# Patient Record
Sex: Female | Born: 1985
Health system: Southern US, Community
[De-identification: ages and names within clinical notes are randomized; demographics above are authoritative.]

## PROBLEM LIST (undated history)

## (undated) DIAGNOSIS — J309 Allergic rhinitis, unspecified: Secondary | ICD-10-CM

## (undated) DIAGNOSIS — M779 Enthesopathy, unspecified: Secondary | ICD-10-CM

## (undated) DIAGNOSIS — N83209 Unspecified ovarian cyst, unspecified side: Secondary | ICD-10-CM

## (undated) DIAGNOSIS — E669 Obesity, unspecified: Secondary | ICD-10-CM

## (undated) DIAGNOSIS — M1990 Unspecified osteoarthritis, unspecified site: Secondary | ICD-10-CM

## (undated) DIAGNOSIS — J129 Viral pneumonia, unspecified: Secondary | ICD-10-CM

## (undated) HISTORY — DX: Unspecified ovarian cyst, unspecified side: N83.209

## (undated) HISTORY — PX: DENTAL SURGERY: SHX609

## (undated) HISTORY — DX: Allergic rhinitis, unspecified: J30.9

## (undated) HISTORY — DX: Viral pneumonia, unspecified: J12.9

## (undated) HISTORY — DX: Obesity, unspecified: E66.9

## (undated) HISTORY — DX: Enthesopathy, unspecified: M77.9

---
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
## 2008-01-03 ENCOUNTER — Emergency Department: Payer: Self-pay | Admitting: Emergency Medicine

## 2008-01-06 ENCOUNTER — Emergency Department: Payer: Self-pay | Admitting: Emergency Medicine

## 2014-12-01 DIAGNOSIS — J129 Viral pneumonia, unspecified: Secondary | ICD-10-CM

## 2014-12-01 HISTORY — DX: Viral pneumonia, unspecified: J12.9

## 2014-12-16 ENCOUNTER — Emergency Department: Payer: Self-pay

## 2015-12-02 NOTE — L&D Delivery Note (Signed)
Obstetrical Delivery Note   Date of Delivery:   11/19/2016 Primary OB:   [facility] Gestational Age/EDD: [redacted]w[redacted]d (Dated by 8wk ultrasound) Antepartum complications: non-reassuring fetal testing and maternal obesity  Delivered By:   Dalia Heading, CNM  Delivery Type:   spontaneous vaginal delivery  Procedure Details:   Had an induction of labor for a non reassuring fetal heart tracing (Cervidil, Pitocin). Called to see patient for lower abdominal pain and was noted to be C/C/+2. With good maternal effort, mother pushed to deliver a vigorous female infant over an intact perineum. Baby placed on mother's abdomen and dried. Cord was clamped (delay of 30-40 sec) and cut by father of baby. Baby placed skin to skin. Baby's temp noted to be 100.1 and mother's temp was 101 after spontaneous delivery of the placenta. No fever 2 hours earlier Anesthesia:    epidural Intrapartum complications: intermittent Cat 2 tracing (decreased variability for short episodes) GBS:    negative Laceration:    none Episiotomy:    none Placenta:    Via active 3rd stage. To pathology: no Estimated Blood Loss:  400 ml  Baby:    Liveborn female, Apgars 8/9, weight pending    Beckie Viscardi, CNM

## 2016-04-01 LAB — OB RESULTS CONSOLE VARICELLA ZOSTER ANTIBODY, IGG: VARICELLA IGG: IMMUNE

## 2016-04-01 LAB — OB RESULTS CONSOLE ANTIBODY SCREEN: Antibody Screen: NEGATIVE

## 2016-04-01 LAB — OB RESULTS CONSOLE RPR: RPR: NONREACTIVE

## 2016-04-01 LAB — OB RESULTS CONSOLE HEPATITIS B SURFACE ANTIGEN: HEP B S AG: NEGATIVE

## 2016-04-01 LAB — OB RESULTS CONSOLE HIV ANTIBODY (ROUTINE TESTING): HIV: NONREACTIVE

## 2016-04-01 LAB — OB RESULTS CONSOLE ABO/RH: RH Type: POSITIVE

## 2016-04-01 LAB — OB RESULTS CONSOLE RUBELLA ANTIBODY, IGM: Rubella: IMMUNE

## 2016-04-03 LAB — OB RESULTS CONSOLE ABO/RH

## 2016-04-03 LAB — OB RESULTS CONSOLE GC/CHLAMYDIA
CHLAMYDIA, DNA PROBE: NEGATIVE
GC PROBE AMP, GENITAL: NEGATIVE

## 2016-04-22 ENCOUNTER — Other Ambulatory Visit: Payer: Self-pay | Admitting: Nurse Practitioner

## 2016-04-22 DIAGNOSIS — R197 Diarrhea, unspecified: Secondary | ICD-10-CM

## 2016-04-22 DIAGNOSIS — R1010 Upper abdominal pain, unspecified: Secondary | ICD-10-CM

## 2016-05-15 ENCOUNTER — Ambulatory Visit
Admission: RE | Admit: 2016-05-15 | Discharge: 2016-05-15 | Disposition: A | Discharge status: Home / Self Care | Payer: 59 | Source: Ambulatory Visit | Attending: Nurse Practitioner | Admitting: Nurse Practitioner

## 2016-05-15 DIAGNOSIS — R109 Unspecified abdominal pain: Secondary | ICD-10-CM | POA: Insufficient documentation

## 2016-05-15 DIAGNOSIS — O9989 Other specified diseases and conditions complicating pregnancy, childbirth and the puerperium: Secondary | ICD-10-CM | POA: Diagnosis present

## 2016-05-15 DIAGNOSIS — R197 Diarrhea, unspecified: Secondary | ICD-10-CM | POA: Diagnosis not present

## 2016-05-15 DIAGNOSIS — Z3A00 Weeks of gestation of pregnancy not specified: Secondary | ICD-10-CM | POA: Insufficient documentation

## 2016-05-15 DIAGNOSIS — R1010 Upper abdominal pain, unspecified: Secondary | ICD-10-CM

## 2016-06-09 ENCOUNTER — Other Ambulatory Visit: Payer: Self-pay | Admitting: Advanced Practice Midwife

## 2016-06-09 DIAGNOSIS — Z3689 Encounter for other specified antenatal screening: Secondary | ICD-10-CM

## 2016-06-23 ENCOUNTER — Ambulatory Visit
Admission: RE | Admit: 2016-06-23 | Discharge: 2016-06-23 | Disposition: A | Discharge status: Home / Self Care | Payer: 59 | Source: Ambulatory Visit | Attending: Obstetrics and Gynecology | Admitting: Obstetrics and Gynecology

## 2016-06-23 ENCOUNTER — Ambulatory Visit: Payer: 59

## 2016-06-23 DIAGNOSIS — Z3A18 18 weeks gestation of pregnancy: Secondary | ICD-10-CM | POA: Diagnosis not present

## 2016-06-23 DIAGNOSIS — IMO0002 Reserved for concepts with insufficient information to code with codable children: Secondary | ICD-10-CM

## 2016-06-23 DIAGNOSIS — O99212 Obesity complicating pregnancy, second trimester: Secondary | ICD-10-CM | POA: Insufficient documentation

## 2016-06-23 DIAGNOSIS — Z3689 Encounter for other specified antenatal screening: Secondary | ICD-10-CM

## 2016-06-23 DIAGNOSIS — E669 Obesity, unspecified: Secondary | ICD-10-CM | POA: Diagnosis not present

## 2016-06-23 DIAGNOSIS — O351XX0 Maternal care for (suspected) chromosomal abnormality in fetus, not applicable or unspecified: Secondary | ICD-10-CM | POA: Insufficient documentation

## 2016-07-04 IMAGING — US US ABDOMEN LIMITED
1 series · 14 of 25 positions shown · non-contrast
Comparison: None.

CLINICAL DATA: 29-year-old pregnant female with upper abdominal
pain. Chronic intermittent diarrhea.

EXAM:
US ABDOMEN LIMITED - RIGHT UPPER QUADRANT

[Series 1: us abdomen limited · 0.25mm/px · 14 of 49 slices shown]
[im 1/49]
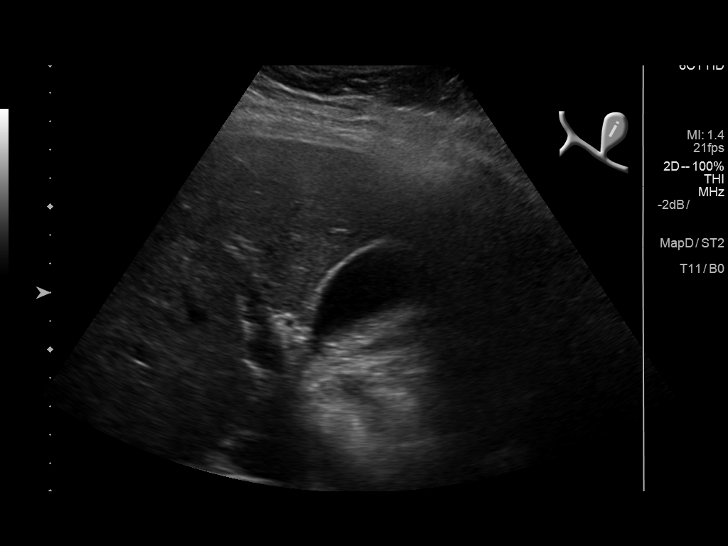
[im 5/49]
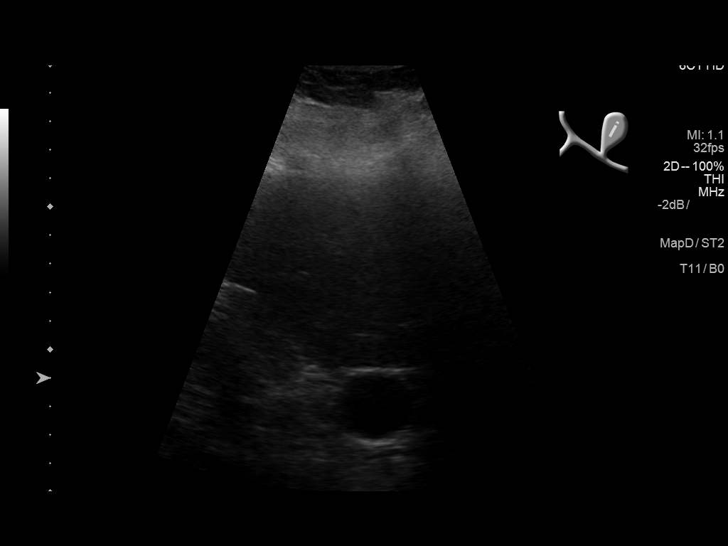
[im 9/49]
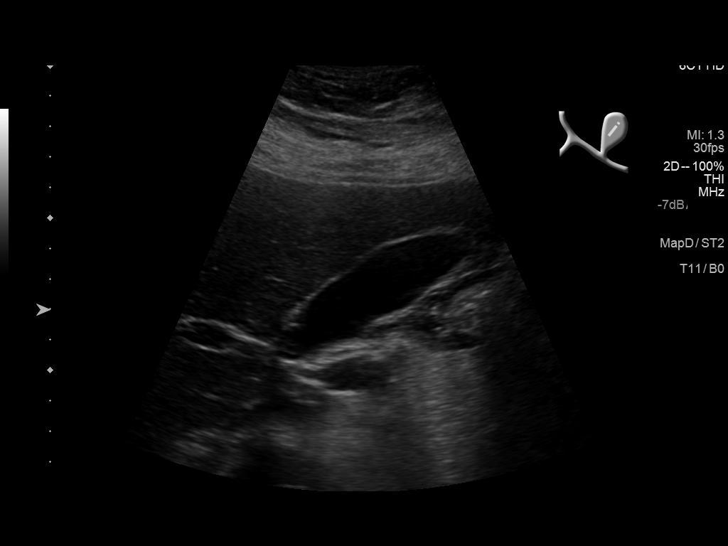
[im 13/49]
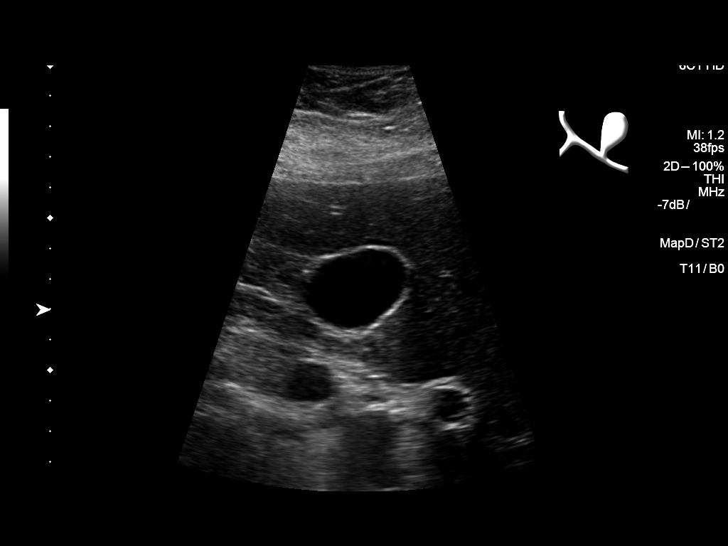
[im 17/49]
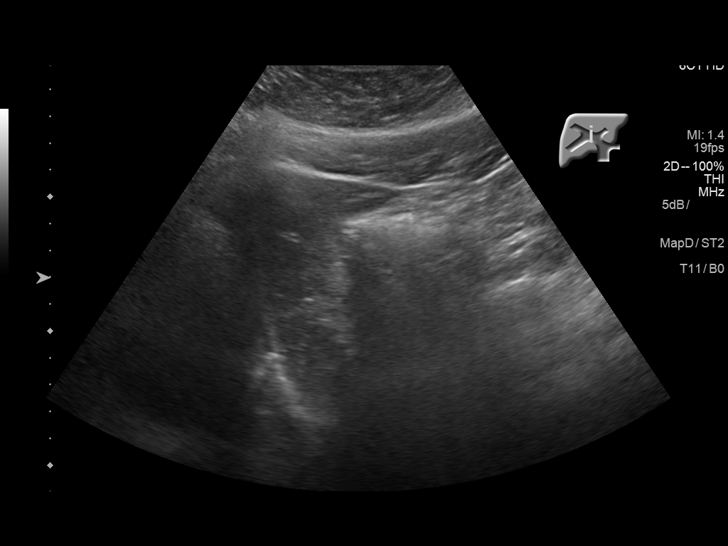
[im 19/49]
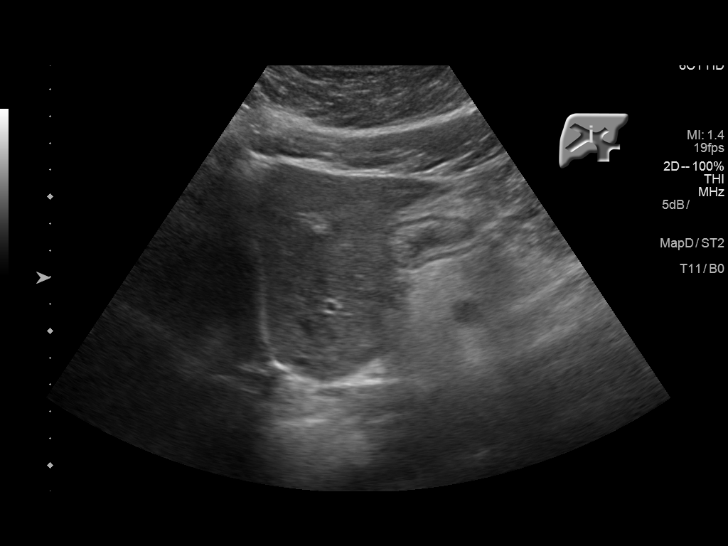
[im 23/49]
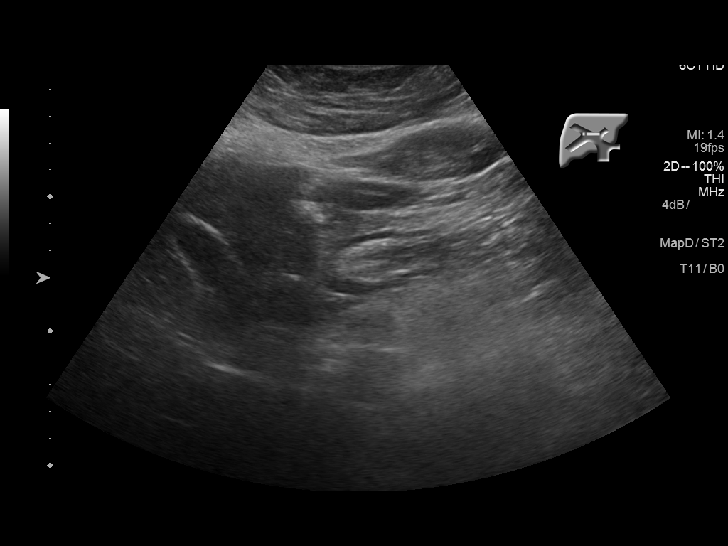
[im 27/49]
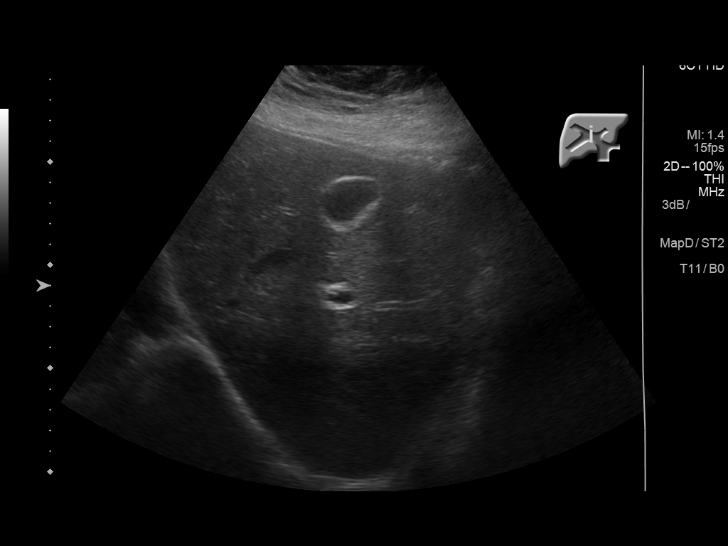
[im 31/49]
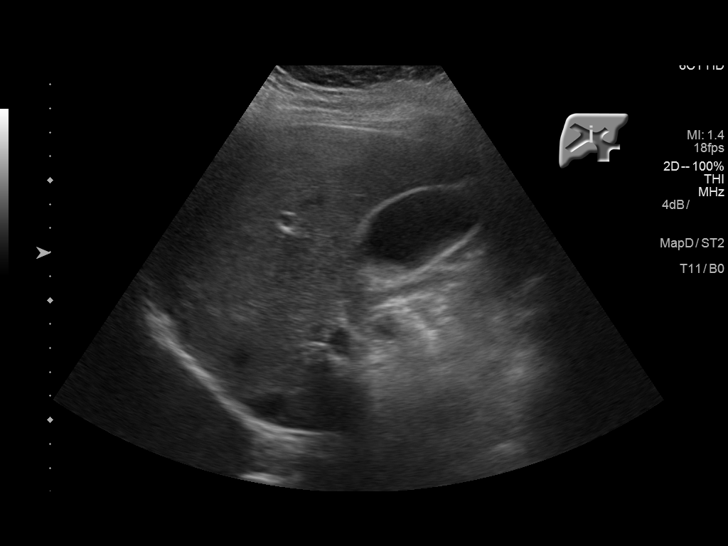
[im 33/49]
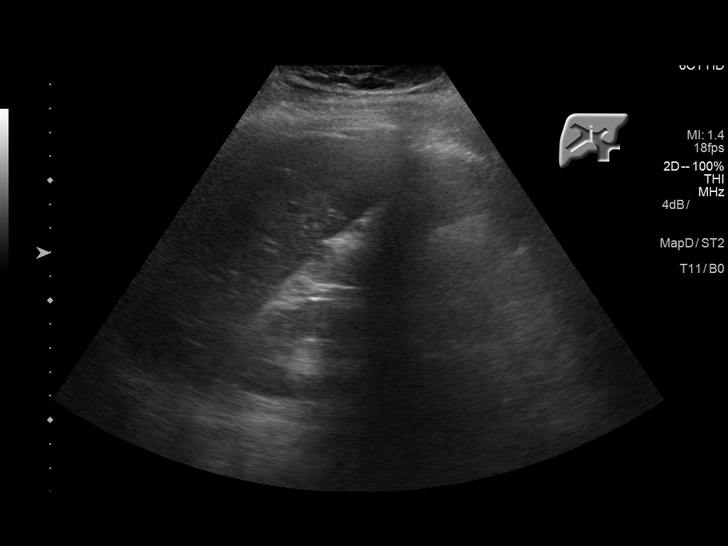
[im 37/49]
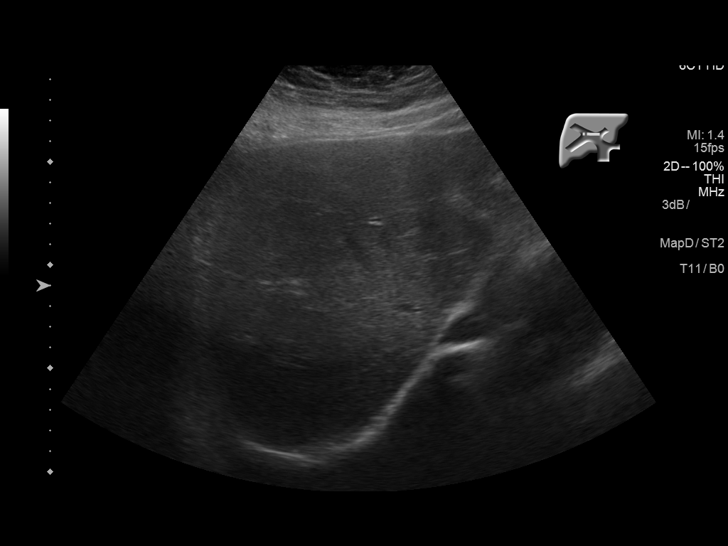
[im 41/49]
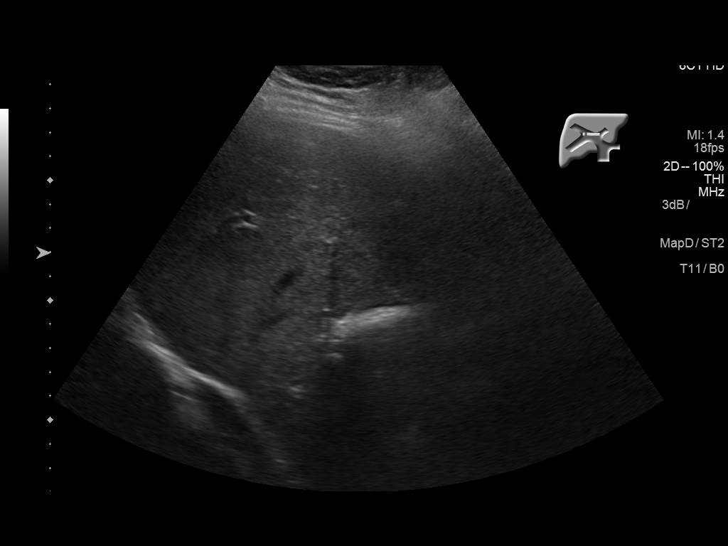
[im 45/49]
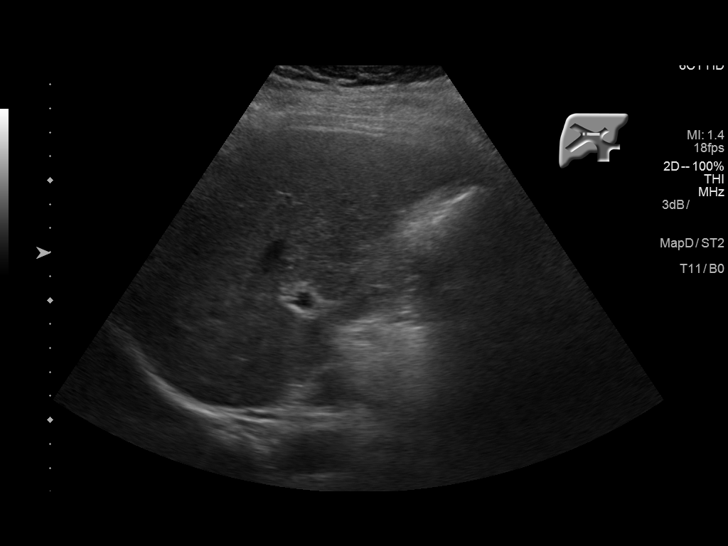
[im 49/49]
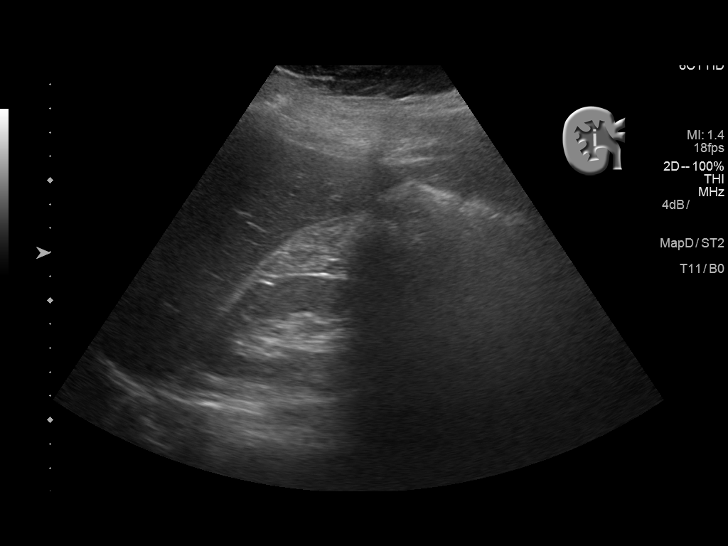

[14 of 25 positions shown; findings below may reference images not displayed]

FINDINGS: Gallbladder:

No gallstones or wall thickening visualized. No sonographic Murphy
sign noted by sonographer.

Common bile duct:

Diameter: 3 mm

Liver:

No focal lesion identified. Within normal limits in parenchymal
echogenicity.
IMPRESSION: Normal right upper quadrant abdominal sonogram, with no
cholelithiasis.

## 2016-08-12 IMAGING — US US MFM OB DETAIL+14 WK
2 series · 14 of 28 positions shown · non-contrast
Comparison: none

[Series 1: us mfm ob detail+14 wk · 0.25mm/px · 13 of 68 slices shown (1 of 2)]
[im 3/68]
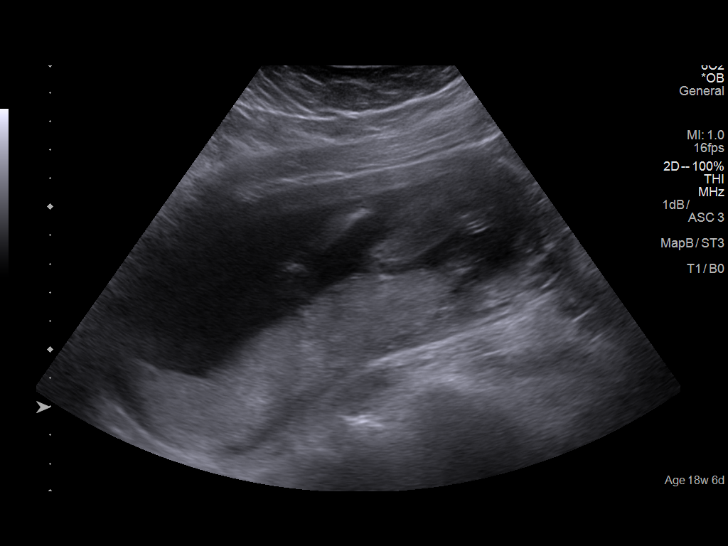
[im 8/68]
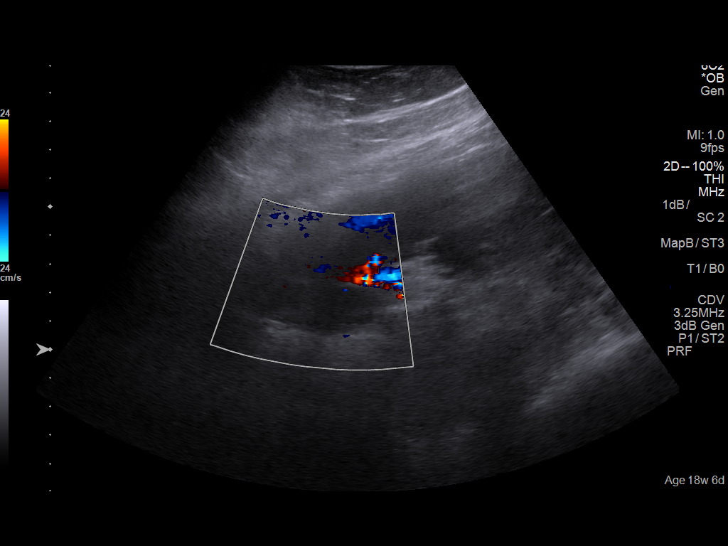
[im 13/68]
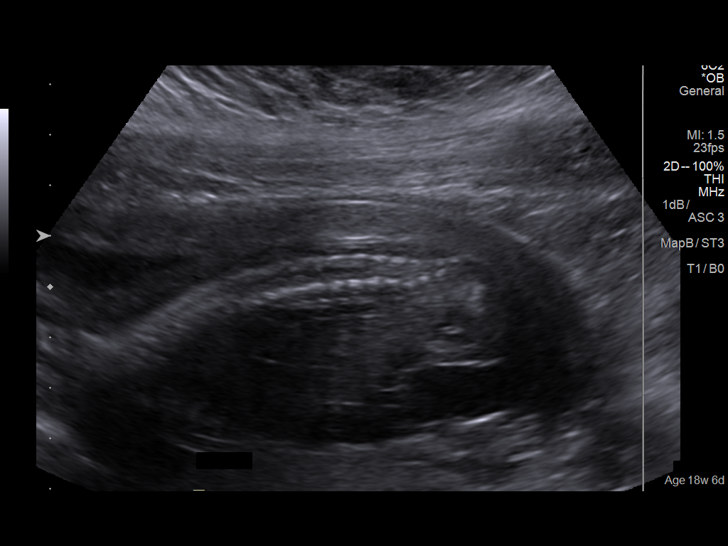
[im 19/68]
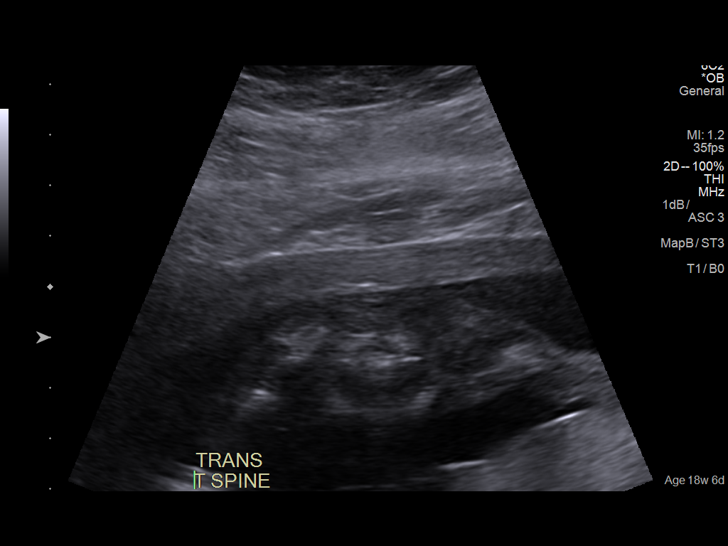
[im 24/68]
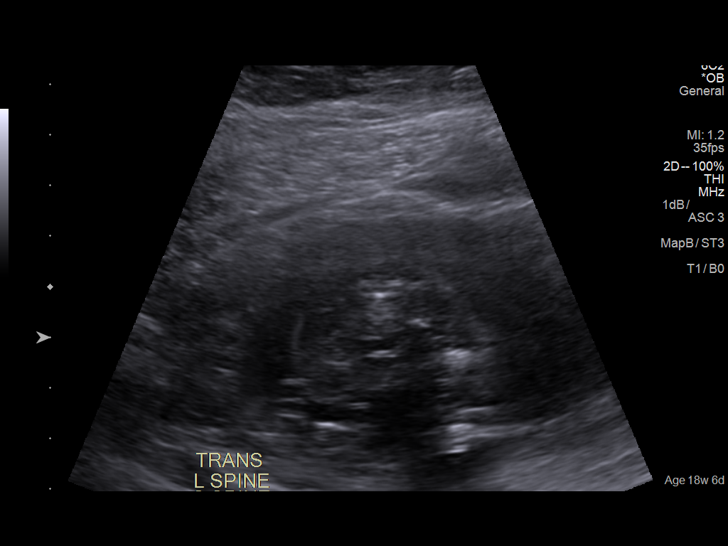
[im 29/68]
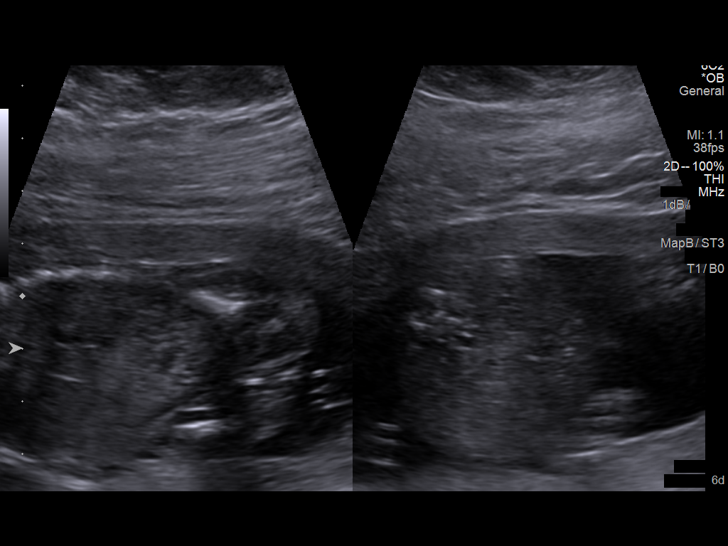
[im 34/68]
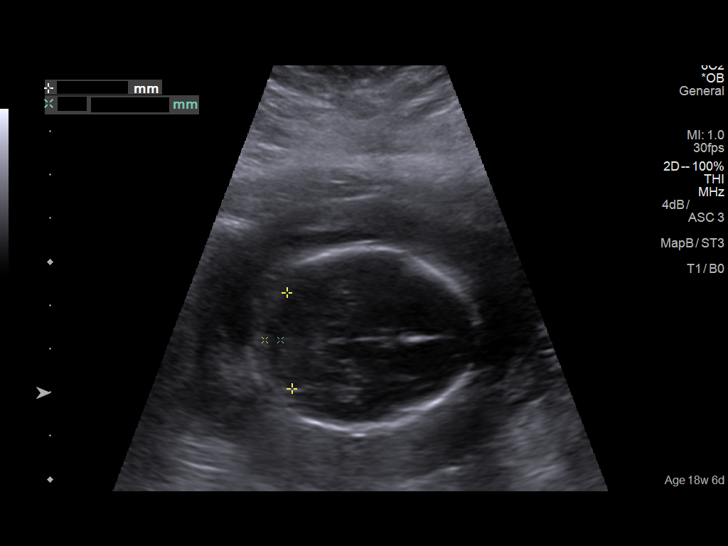
[im 39/68]
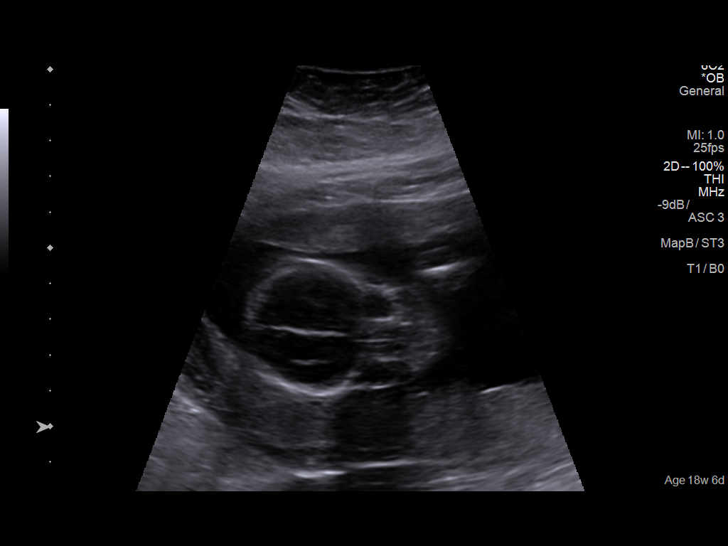
[im 44/68]
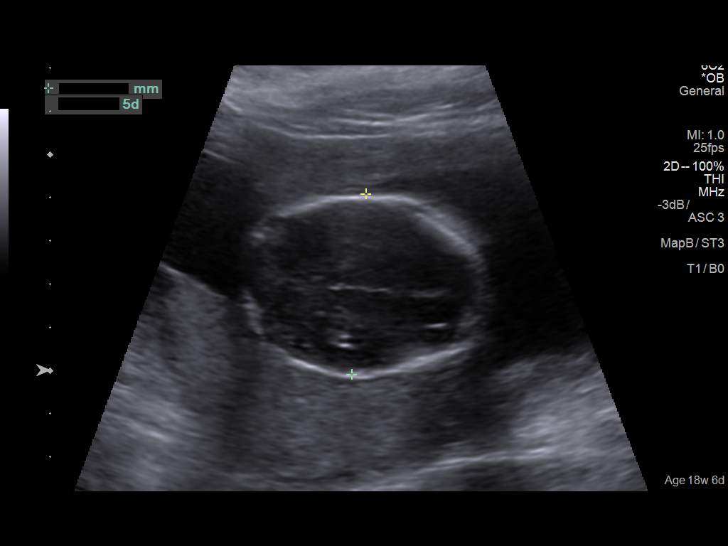
[im 49/68]
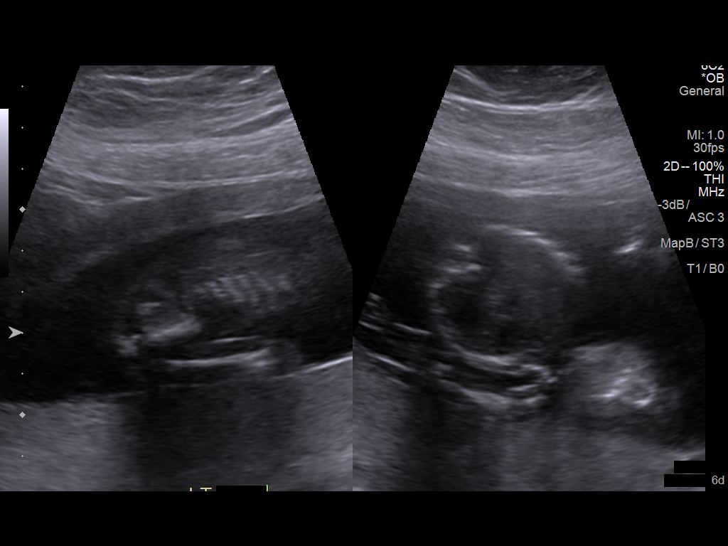
[im 55/68]
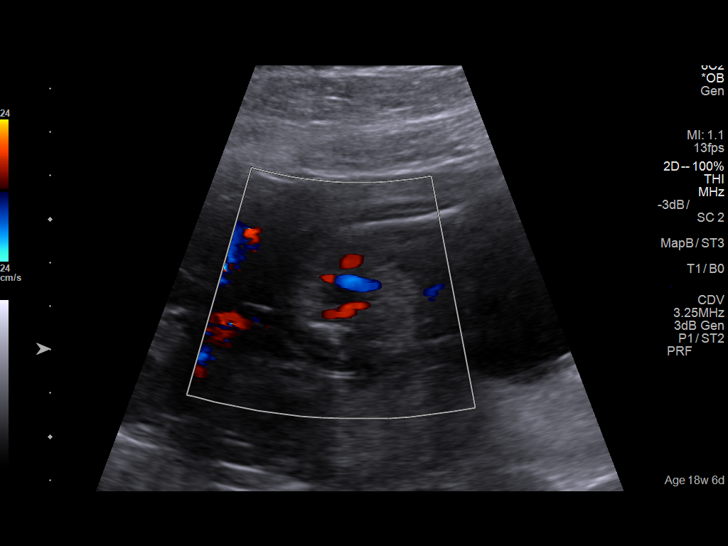
[im 60/68]
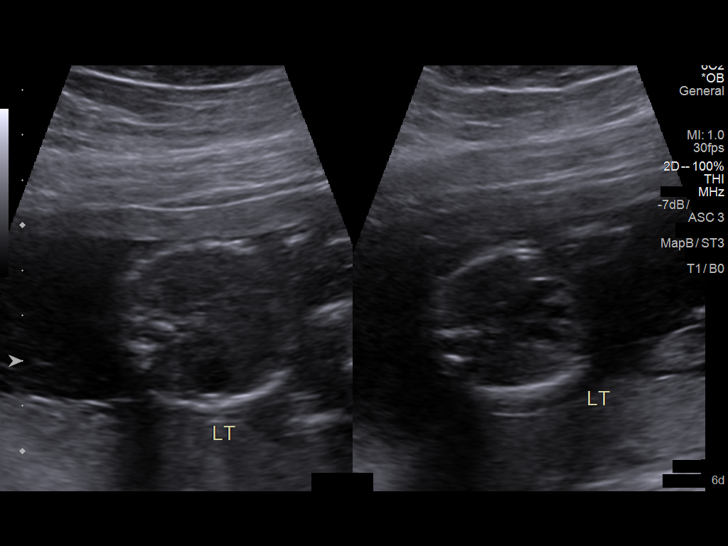
[im 65/68]
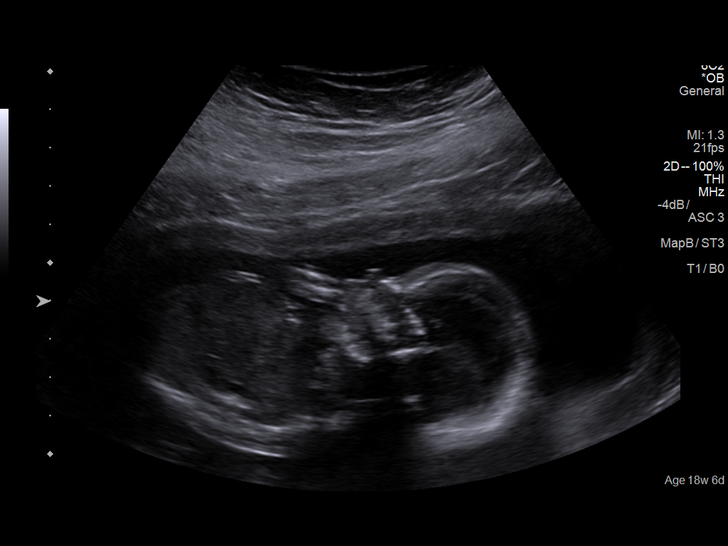

[Series 1001: us mfm ob detail+14 wk · 0.19mm/px · 1 of 3 slices shown (2 of 2)]
[im 1/3]
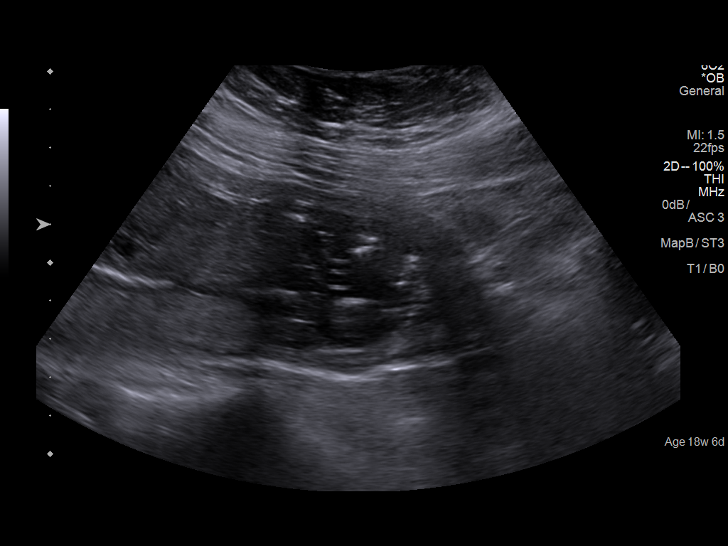

[14 of 28 positions shown; findings below may reference images not displayed]

Canned report from images found in remote index.

Refer to host system for actual result text.

## 2016-10-21 LAB — OB RESULTS CONSOLE GBS: GBS: NEGATIVE

## 2016-11-17 ENCOUNTER — Inpatient Hospital Stay
Admission: EM | Admit: 2016-11-17 | Discharge: 2016-11-20 | DRG: 774 | Disposition: A | Discharge status: Home / Self Care | Payer: 59 | Attending: Certified Nurse Midwife | Admitting: Certified Nurse Midwife

## 2016-11-17 DIAGNOSIS — O99214 Obesity complicating childbirth: Secondary | ICD-10-CM | POA: Diagnosis present

## 2016-11-17 DIAGNOSIS — O864 Pyrexia of unknown origin following delivery: Secondary | ICD-10-CM | POA: Diagnosis not present

## 2016-11-17 DIAGNOSIS — Z6841 Body Mass Index (BMI) 40.0 and over, adult: Secondary | ICD-10-CM | POA: Diagnosis not present

## 2016-11-17 DIAGNOSIS — Z87891 Personal history of nicotine dependence: Secondary | ICD-10-CM | POA: Diagnosis not present

## 2016-11-17 DIAGNOSIS — IMO0002 Reserved for concepts with insufficient information to code with codable children: Secondary | ICD-10-CM | POA: Diagnosis present

## 2016-11-17 DIAGNOSIS — Z3A39 39 weeks gestation of pregnancy: Secondary | ICD-10-CM

## 2016-11-17 LAB — TYPE AND SCREEN
ABO/RH(D): O POS
ANTIBODY SCREEN: NEGATIVE

## 2016-11-17 LAB — CBC
HCT: 36.1 % (ref 35.0–47.0)
HEMOGLOBIN: 12.8 g/dL (ref 12.0–16.0)
MCH: 31.9 pg (ref 26.0–34.0)
MCHC: 35.5 g/dL (ref 32.0–36.0)
MCV: 90 fL (ref 80.0–100.0)
PLATELETS: 241 10*3/uL (ref 150–440)
RBC: 4 MIL/uL (ref 3.80–5.20)
RDW: 13.9 % (ref 11.5–14.5)
WBC: 9.1 10*3/uL (ref 3.6–11.0)

## 2016-11-17 MED ORDER — BUTORPHANOL TARTRATE 1 MG/ML IJ SOLN
1.0000 mg | INTRAMUSCULAR | Status: DC | PRN
  Administered 2016-11-18: 1 mg via INTRAVENOUS
  Filled 2016-11-17: qty 1

## 2016-11-17 MED ORDER — ONDANSETRON HCL 4 MG/2ML IJ SOLN
4.0000 mg | Freq: Four times a day (QID) | INTRAMUSCULAR | Status: DC | PRN
  Administered 2016-11-18: 4 mg via INTRAVENOUS
  Filled 2016-11-17: qty 2

## 2016-11-17 MED ORDER — OXYTOCIN 40 UNITS IN LACTATED RINGERS INFUSION - SIMPLE MED
2.5000 [IU]/h | INTRAVENOUS | Status: DC
  Filled 2016-11-17 (×2): qty 1000

## 2016-11-17 MED ORDER — LACTATED RINGERS IV SOLN
500.0000 mL | INTRAVENOUS | Status: DC | PRN
  Administered 2016-11-17: 500 mL via INTRAVENOUS

## 2016-11-17 MED ORDER — TERBUTALINE SULFATE 1 MG/ML IJ SOLN: 0.2500 mg | Freq: Once | INTRAMUSCULAR | Status: DC | PRN

## 2016-11-17 MED ORDER — OXYTOCIN BOLUS FROM INFUSION
500.0000 mL | Freq: Once | INTRAVENOUS | Status: AC
  Administered 2016-11-19: 500 mL via INTRAVENOUS

## 2016-11-17 MED ORDER — LACTATED RINGERS IV SOLN
INTRAVENOUS | Status: DC
  Administered 2016-11-17 – 2016-11-19 (×4): via INTRAVENOUS

## 2016-11-17 MED ORDER — DINOPROSTONE 10 MG VA INST
10.0000 mg | VAGINAL_INSERT | Freq: Once | VAGINAL | Status: AC
  Administered 2016-11-17: 10 mg via VAGINAL
  Filled 2016-11-17 (×2): qty 1

## 2016-11-17 MED ORDER — ACETAMINOPHEN 325 MG PO TABS
650.0000 mg | ORAL_TABLET | ORAL | Status: DC | PRN
  Administered 2016-11-19: 650 mg via ORAL
  Filled 2016-11-17: qty 2

## 2016-11-17 NOTE — H&P (Signed)
Obstetric History and Physical  Emily Zhang is a 30 y.o. G2P0101 with Estimated Date of Delivery: 11/18/16 per 8 wk [place] who presents at [redacted]w[redacted]d  presenting for IOL for obesity and cat 2 FHR tracing in office today. Pt had routine NST/AFI today, period of decreased variability and mild decelerations -unable to determine late vs early vs unrelated to contractions. Patient states she has been having rare contractions, no vaginal bleeding, intact membranes, with active fetal movement.    Prenatal Course Source of Care: [facility]  with onset of care at 8 weeks Pregnancy complications or risks: -BMI >40 - most recent EFW 76% -H/o 36 delivery - IOL for advanced dilation -Cystic hygroma seen in early pregnancy - not seen on repeat. Negative Panorama.  -Mild fetal pericardial effusion - fetal echo done.   Patient Active Problem List   Diagnosis Date Noted  . Abnormal fetal heart rate 11/17/2016   She plans to breastfeed She desires condoms for postpartum contraception.   Prenatal labs and studies: ABO, Rh: O+  Antibody: Negative Rubella: Immune Varicella: Immune RPR:  Non-reactive HBsAg:  Negative HIV: Negative GC/CT: Neg/Neg GBS: Negative 1 hr Glucola: 94   Genetic screening: Panorama normal    Prenatal Transfer Tool   Past Medical History:  Diagnosis Date  . Medical history non-contributory     Past Surgical History:  Procedure Laterality Date  . DENTAL SURGERY      OB History  Gravida Para Term Preterm AB Living  2 1 0 1   1  SAB TAB Ectopic Multiple Live Births          1    # Outcome Date GA Lbr Len/2nd Weight Sex Delivery Anes PTL Lv  2 Current           1 Preterm 11/25/02 [redacted]w[redacted]d   M Vag-Spont   LIV    Obstetric Comments  IOL for advanced dilation    Social History   Social History  . Marital status: Married    Spouse name: N/A  . Number of children: N/A  . Years of education: N/A   Social History Main Topics  . Smoking status: Former Research scientist (life sciences)  .  Smokeless tobacco: Never Used  . Alcohol use No  . Drug use: No  . Sexual activity: Yes   Other Topics Concern  . None   Social History Narrative  . None    History reviewed. No pertinent family history.  Prescriptions Prior to Admission  Medication Sig Dispense Refill Last Dose  . acetaminophen (TYLENOL) 500 MG tablet Take 500 mg by mouth every 6 (six) hours as needed.   11/16/2016  . Prenatal Vit-Fe Fumarate-FA (PRENATAL MULTIVITAMIN) TABS tablet Take 1 tablet by mouth daily at 12 noon.   11/16/2016  . loratadine (CLARITIN) 10 MG tablet Take 10 mg by mouth daily as needed for allergies.   Taking    Allergies  Allergen Reactions  . Vicodin [Hydrocodone-Acetaminophen] Rash    Review of Systems: Negative except for what is mentioned in HPI.  Physical Exam: BP 121/74   Pulse 88   Temp 97.9 F (36.6 C) (Oral)   Resp 18   Ht 5\' 3"  (1.6 m)   Wt 244 lb (110.7 kg)   LMP 02/12/2016   BMI 43.22 kg/m  GENERAL: Well-developed, well-nourished female in no acute distress.  LUNGS: Unlabored breathing  ABDOMEN: Soft, nontender, nondistended, gravid. EXTREMITIES: Nontender, no edema Cervical Exam: Dilatation 1cm   Effacement 30%   Station -3 at clinic  today   Presentation: cephalic FHT: Category: 1 Baseline rate 135 bpm   Variability moderate  Accelerations present   Decelerations none Contractions: irregular   Pertinent Labs/Studies:   No results found for this or any previous visit (from the past 24 hour(s)).  Assessment : IUP at [redacted]w[redacted]d, IOL for Obesity and cat 2 tracing   Plan: Admission for delivery  Cervidil now, ambien overnight if needed FWB - cat 1 tracing currently, will monitor closely

## 2016-11-18 ENCOUNTER — Inpatient Hospital Stay: Payer: 59 | Admitting: Anesthesiology

## 2016-11-18 LAB — RPR: RPR: NONREACTIVE

## 2016-11-18 MED ORDER — DIPHENHYDRAMINE HCL 50 MG/ML IJ SOLN: 12.5000 mg | INTRAMUSCULAR | Status: DC | PRN

## 2016-11-18 MED ORDER — LIDOCAINE HCL (PF) 1 % IJ SOLN
INTRAMUSCULAR | Status: DC | PRN
  Administered 2016-11-18: 3 mL via SUBCUTANEOUS

## 2016-11-18 MED ORDER — AMMONIA AROMATIC IN INHA
RESPIRATORY_TRACT | Status: AC
  Filled 2016-11-18: qty 10

## 2016-11-18 MED ORDER — ZOLPIDEM TARTRATE 5 MG PO TABS
ORAL_TABLET | ORAL | Status: AC
  Administered 2016-11-18: 5 mg via ORAL
  Filled 2016-11-18: qty 1

## 2016-11-18 MED ORDER — LIDOCAINE HCL (PF) 1 % IJ SOLN
INTRAMUSCULAR | Status: AC
  Filled 2016-11-18: qty 30

## 2016-11-18 MED ORDER — FENTANYL 2.5 MCG/ML W/ROPIVACAINE 0.2% IN NS 100 ML EPIDURAL INFUSION (ARMC-ANES)
9.0000 mL/h | EPIDURAL | Status: DC
  Administered 2016-11-19: 20 mL/h via EPIDURAL
  Administered 2016-11-19: 9 mL/h via EPIDURAL
  Filled 2016-11-18: qty 100

## 2016-11-18 MED ORDER — SODIUM CHLORIDE FLUSH 0.9 % IV SOLN
INTRAVENOUS | Status: AC
  Filled 2016-11-18: qty 10

## 2016-11-18 MED ORDER — ZOLPIDEM TARTRATE 5 MG PO TABS
5.0000 mg | ORAL_TABLET | Freq: Every evening | ORAL | Status: DC | PRN
  Administered 2016-11-18: 5 mg via ORAL

## 2016-11-18 MED ORDER — FENTANYL 2.5 MCG/ML W/ROPIVACAINE 0.2% IN NS 100 ML EPIDURAL INFUSION (ARMC-ANES)
EPIDURAL | Status: DC | PRN
  Administered 2016-11-18: 10 mL/h via EPIDURAL

## 2016-11-18 MED ORDER — LACTATED RINGERS IV SOLN: 500.0000 mL | Freq: Once | INTRAVENOUS | Status: DC

## 2016-11-18 MED ORDER — FENTANYL 2.5 MCG/ML W/ROPIVACAINE 0.2% IN NS 100 ML EPIDURAL INFUSION (ARMC-ANES)
EPIDURAL | Status: AC
  Filled 2016-11-18: qty 100

## 2016-11-18 MED ORDER — EPHEDRINE 5 MG/ML INJ
10.0000 mg | INTRAVENOUS | Status: DC | PRN
  Filled 2016-11-18: qty 2

## 2016-11-18 MED ORDER — TERBUTALINE SULFATE 1 MG/ML IJ SOLN: 0.2500 mg | Freq: Once | INTRAMUSCULAR | Status: DC | PRN

## 2016-11-18 MED ORDER — BUPIVACAINE HCL (PF) 0.25 % IJ SOLN
INTRAMUSCULAR | Status: DC | PRN
  Administered 2016-11-18: 3 mL via EPIDURAL
  Administered 2016-11-18: 5 mL via EPIDURAL

## 2016-11-18 MED ORDER — PHENYLEPHRINE 40 MCG/ML (10ML) SYRINGE FOR IV PUSH (FOR BLOOD PRESSURE SUPPORT)
80.0000 ug | PREFILLED_SYRINGE | INTRAVENOUS | Status: DC | PRN
  Filled 2016-11-18: qty 5

## 2016-11-18 MED ORDER — OXYTOCIN 40 UNITS IN LACTATED RINGERS INFUSION - SIMPLE MED
1.0000 m[IU]/min | INTRAVENOUS | Status: DC
  Administered 2016-11-18: 2 m[IU]/min via INTRAVENOUS

## 2016-11-18 MED ORDER — SODIUM CHLORIDE 0.9 % IJ SOLN
INTRAMUSCULAR | Status: AC
  Filled 2016-11-18: qty 50

## 2016-11-18 MED ORDER — MISOPROSTOL 200 MCG PO TABS
ORAL_TABLET | ORAL | Status: AC
  Filled 2016-11-18: qty 4

## 2016-11-18 MED ORDER — LIDOCAINE-EPINEPHRINE (PF) 1.5 %-1:200000 IJ SOLN
INTRAMUSCULAR | Status: DC | PRN
  Administered 2016-11-18: 3 mL via EPIDURAL

## 2016-11-18 MED ORDER — BUTORPHANOL TARTRATE 1 MG/ML IJ SOLN
1.0000 mg | INTRAMUSCULAR | Status: DC | PRN
  Administered 2016-11-18 (×2): 1 mg via INTRAVENOUS
  Filled 2016-11-18 (×2): qty 1

## 2016-11-18 NOTE — Progress Notes (Signed)
L&D Progress Note  S: Comfortable with epidural  O: 93/50 General: resting comfortably FHR: 130 baseline with accelerations to 150s to 160s, moderate variability. Will have episodes of decreased variability x 20-30 minutes at a time, then resume Cat 1 tracing Toco: 2-3 in 10 min Cervix: 5.5/80%/-1  A: IOL for Cat 2 tracing  P: IUPC inserted: mvus <100 mm HG Start Pitocin augmentation Continue to monitor FWB closely.  Dalia Heading, CNM

## 2016-11-18 NOTE — Progress Notes (Signed)
PAD #2 IOL for CAt 2 tracing  S: Rating contractions 6/10 in intensity. Cervidil removed earlier this AM (0630)  O: BP 126/81 (BP Location: Left Arm)   Pulse 90   Temp 97.4 F (36.3 C) (Oral)   Resp 20   Ht 5\' 3"  (1.6 m)   Wt 244 lb (110.7 kg)   LMP 02/12/2016   BMI 43.22 kg/m   General: breathing thru contractions, appears uncomfortable FHR: 130s baseline with accelerations to 150, moderate variability Contractions q 2-5 min apart with coupling/ tripling Cervix: 2/60%/-3 EFW: 9#  A: IUP at 40 wk for IOL for Cat 2 tracing FWB: Cat 1 tracing  P: Foley bulb placed in cervix Hold Pitocin for know since contractions are getting stronger on their own Stadol for pain  Caris Cerveny, CNM

## 2016-11-18 NOTE — Progress Notes (Signed)
L&D progress Note  S: Crying out with painful contractions. Bag of water broke spontaneously  O: 128/64 General: very uncomfortable, crying with painful contractions, Stadol not effective FHR: 130 with accelerations to 150s, moderate variability Toco: q4-6 min apart with some coupling SROM-moderate amt clear fluid at 1507, just after foley bulb removed from vagina. Cervix: 4/70%/-3  A: IOL for Cat 2 tracing-progressing FWB-Cat 1 SROM clear fluid  P: Epidural for pain relief Continue to monitor progress, if contractions space out or progress stops, then start Pitocin augmentation  Emily Zhang, CNM

## 2016-11-18 NOTE — Anesthesia Procedure Notes (Signed)
Epidural  Start time: 11/18/2016 3:24 PM End time: 11/18/2016 3:41 PM  Staffing Anesthesiologist: Alvin Critchley Resident/CRNA: Aline Brochure Performed: resident/CRNA   Preanesthetic Checklist Completed: patient identified, site marked, surgical consent, pre-op evaluation, timeout performed, IV checked, risks and benefits discussed and monitors and equipment checked  Epidural Patient position: sitting Prep: ChloraPrep Patient monitoring: heart rate, continuous pulse ox and blood pressure Approach: midline Location: L3-L4 Injection technique: LOR air  Needle:  Needle type: Tuohy  Needle gauge: 17 G Needle length: 9 cm Needle insertion depth: 8 cm Catheter type: closed end flexible Catheter size: 19 Gauge Catheter at skin depth: 12 cm Test dose: negative and 1.5% lidocaine with Epi 1:200 K  Assessment Events: blood not aspirated, injection not painful, no injection resistance, negative IV test and no paresthesia  Additional Notes Reason for block:procedure for pain

## 2016-11-18 NOTE — Anesthesia Preprocedure Evaluation (Addendum)
Anesthesia Evaluation  Patient identified by MRN, date of birth, ID band Patient awake    Reviewed: Allergy & Precautions, H&P , NPO status , Patient's Chart, lab work & pertinent test results  History of Anesthesia Complications Negative for: history of anesthetic complications  Airway Mallampati: III   Neck ROM: full    Dental no notable dental hx. (+) Teeth Intact   Pulmonary former smoker,           Cardiovascular negative cardio ROS       Neuro/Psych    GI/Hepatic   Endo/Other    Renal/GU      Musculoskeletal   Abdominal   Peds  Hematology   Anesthesia Other Findings   Reproductive/Obstetrics (+) Pregnancy                             Anesthesia Physical Anesthesia Plan  ASA: II  Anesthesia Plan: Epidural   Post-op Pain Management:    Induction:   Airway Management Planned:   Additional Equipment:   Intra-op Plan:   Post-operative Plan:   Informed Consent: I have reviewed the patients History and Physical, chart, labs and discussed the procedure including the risks, benefits and alternatives for the proposed anesthesia with the patient or authorized representative who has indicated his/her understanding and acceptance.     Plan Discussed with: Anesthesiologist  Anesthesia Plan Comments:         Anesthesia Quick Evaluation

## 2016-11-19 ENCOUNTER — Encounter: Payer: Self-pay | Admitting: Certified Nurse Midwife

## 2016-11-19 MED ORDER — DIBUCAINE 1 % RE OINT: 1.0000 "application " | TOPICAL_OINTMENT | RECTAL | Status: DC | PRN

## 2016-11-19 MED ORDER — FERROUS SULFATE 325 (65 FE) MG PO TABS
325.0000 mg | ORAL_TABLET | Freq: Every day | ORAL | Status: DC
  Administered 2016-11-19 – 2016-11-20 (×2): 325 mg via ORAL
  Filled 2016-11-19 (×2): qty 1

## 2016-11-19 MED ORDER — SIMETHICONE 80 MG PO CHEW: 80.0000 mg | CHEWABLE_TABLET | ORAL | Status: DC | PRN

## 2016-11-19 MED ORDER — WITCH HAZEL-GLYCERIN EX PADS: 1.0000 "application " | MEDICATED_PAD | CUTANEOUS | Status: DC | PRN

## 2016-11-19 MED ORDER — BENZOCAINE-MENTHOL 20-0.5 % EX AERO
1.0000 "application " | INHALATION_SPRAY | CUTANEOUS | Status: DC | PRN
  Administered 2016-11-19: 1 via TOPICAL
  Filled 2016-11-19: qty 56

## 2016-11-19 MED ORDER — ONDANSETRON HCL 4 MG PO TABS: 4.0000 mg | ORAL_TABLET | ORAL | Status: DC | PRN

## 2016-11-19 MED ORDER — PRENATAL MULTIVITAMIN CH
1.0000 | ORAL_TABLET | Freq: Every day | ORAL | Status: DC
  Administered 2016-11-19 – 2016-11-20 (×2): 1 via ORAL
  Filled 2016-11-19 (×2): qty 1

## 2016-11-19 MED ORDER — SENNOSIDES-DOCUSATE SODIUM 8.6-50 MG PO TABS
2.0000 | ORAL_TABLET | ORAL | Status: DC
  Administered 2016-11-20: 2 via ORAL
  Filled 2016-11-19: qty 2

## 2016-11-19 MED ORDER — IBUPROFEN 600 MG PO TABS
600.0000 mg | ORAL_TABLET | Freq: Four times a day (QID) | ORAL | Status: DC
  Administered 2016-11-19 – 2016-11-20 (×4): 600 mg via ORAL
  Filled 2016-11-19 (×4): qty 1

## 2016-11-19 MED ORDER — COCONUT OIL OIL
1.0000 "application " | TOPICAL_OIL | Status: DC | PRN
  Administered 2016-11-19: 1 via TOPICAL
  Filled 2016-11-19: qty 120

## 2016-11-19 MED ORDER — ONDANSETRON HCL 4 MG/2ML IJ SOLN: 4.0000 mg | INTRAMUSCULAR | Status: DC | PRN

## 2016-11-19 NOTE — Lactation Note (Signed)
This note was copied from a baby's chart. Lactation Consultation Note  Patient Name: Girl Laren Lawrie [id number] Date: 11/19/2016 Reason for consult: Initial assessment;Difficult latch Mom c/o difficult latch R/T flat nipples. Areolae are quite edematous which Mom says makes her nipples less evert than normal. We tried reverse pressure with little effect. It did soften the area enough for Mom to correctly apply shield after some counseling. Baby latched on and needed help flanging lips outward for proper seal. Once that happened, baby began rhythmic, strong suck swallow pattern, almost gulping. I reviewed instructions with parents. They denied any other questions or needs now,. To wear shells during day, and try to nurse wihtout shield by tomorrow with [facility] guidance.   Maternal Data    Feeding Feeding Type: Breast Fed  LATCH Score/Interventions Latch: Grasps breast easily, tongue down, lips flanged, rhythmical sucking.  Audible Swallowing: Spontaneous and intermittent  Type of Nipple: Flat (edematous areola) Intervention(s): Reverse pressure;Shells (hand pump at bedside if needed. )  Comfort (Breast/Nipple): Soft / non-tender     Hold (Positioning): No assistance needed to correctly position infant at breast.  LATCH Score: 9  Lactation Tools Discussed/Used Tools: Nipple Shields Nipple shield size: 24   Consult Status Consult Status: Follow-up Date: 11/20/16 Follow-up type: In-patient    Marnee Spring 11/19/2016, 1:43 PM

## 2016-11-19 NOTE — Discharge Summary (Signed)
Physician Obstetric Discharge Summary  Patient ID: Emily Zhang MRN: [id number] DOB/AGE: 1986/09/17 30 y.o.   Date of Admission: 11/17/2016  Date of Discharge:  11/20/2016   Admitting Diagnosis: Induction of labor at [redacted]w[redacted]d  Secondary Diagnosis: Non reassuring fetal heart rate tracing, maternal obesity with BMI>40  Mode of Delivery: normal spontaneous vaginal delivery 12/120/2017      Discharge Diagnosis: term intrauterine pregnancy delivered   Intrapartum Procedures: epidural, pitocin augmentation, placement of intrauterine catheter and Cervidil   Post partum procedures: None  Complications: maternal fever noted immediately postpartum   Brief Hospital Course  Emily Zhang is a [id number] who had a SVD on 11/19/2016 after an induction of labor for a Cat 2 tracing;  for further details of this delivery, please refer to the delivery note.  Patient had an uncomplicated postpartum course.  By time of discharge on PPD#1, her pain was controlled on oral pain medications; she had appropriate lochia and was ambulating, voiding without difficulty and tolerating regular diet.  She had no further issues with fever and had no fundal tenderness nor signs of infection.  She was deemed stable for discharge to home.    Labs: CBC Latest Ref Rng & Units 11/20/2016 11/17/2016  WBC 3.6 - 11.0 K/uL 15.8(H) 9.1  Hemoglobin 12.0 - 16.0 g/dL 11.3(L) 12.8  Hematocrit 35.0 - 47.0 % 32.7(L) 36.1  Platelets 150 - 440 K/uL 215 241   O POS  Physical exam:  Blood pressure 111/60, pulse 72, temperature 98.2 F (36.8 C), temperature source Oral, resp. rate 20, height 5\' 3"  (1.6 m), weight 244 lb (110.7 kg), last menstrual period 02/12/2016, SpO2 99 %, unknown if currently breastfeeding. General: alert and no distress Lochia: appropriate Abdomen: soft, NT Uterine Fundus: firm Extremities: No evidence of DVT seen on physical exam. No lower extremity edema.  Discharge Instructions: Per  After Visit Summary. Activity: Advance as tolerated. Pelvic rest for 6 weeks.  Also refer to Discharge Instructions Diet: Regular Medications: Allergies as of 11/20/2016      Reactions   Vicodin [hydrocodone-acetaminophen] Rash      Medication List    TAKE these medications   acetaminophen 500 MG tablet Commonly known as:  TYLENOL Take 500 mg by mouth every 6 (six) hours as needed.   ibuprofen 600 MG tablet Commonly known as:  ADVIL,MOTRIN Take 1 tablet (600 mg total) by mouth every 6 (six) hours.   loratadine 10 MG tablet Commonly known as:  CLARITIN Take 10 mg by mouth daily as needed for allergies.   prenatal multivitamin Tabs tablet Take 1 tablet by mouth daily at 12 noon.      Outpatient follow up:  Follow-up Information    GUTIERREZ, COLLEEN, CNM Follow up.   Specialty:  Certified Nurse Midwife Why:  Make an appt as soon as possible for 6 week check up Contact information: [place] [address] [phone]          Postpartum contraception: condoms  Discharged Condition: good  Discharged to: home   Newborn Data: Disposition:home with mother  Apgars: APGAR (1 MIN): 8   APGAR (5 MINS): 9   APGAR (10 MINS):    Baby Feeding: Breast/ Glendell Docker, MD 11/20/2016 7:32 AM

## 2016-11-19 NOTE — Progress Notes (Signed)
Patient and belongings transferred without difficulty to room 336 via wheelchair accompanied by family. Report given to M/B Rn, no questions at this time.

## 2016-11-20 LAB — CBC
HEMATOCRIT: 32.7 % — AB (ref 35.0–47.0)
HEMOGLOBIN: 11.3 g/dL — AB (ref 12.0–16.0)
MCH: 31.8 pg (ref 26.0–34.0)
MCHC: 34.6 g/dL (ref 32.0–36.0)
MCV: 91.8 fL (ref 80.0–100.0)
Platelets: 215 10*3/uL (ref 150–440)
RBC: 3.56 MIL/uL — AB (ref 3.80–5.20)
RDW: 14.3 % (ref 11.5–14.5)
WBC: 15.8 10*3/uL — ABNORMAL HIGH (ref 3.6–11.0)

## 2016-11-20 MED ORDER — IBUPROFEN 600 MG PO TABS: 600.0000 mg | ORAL_TABLET | Freq: Four times a day (QID) | ORAL | 0 refills | Status: DC

## 2016-11-20 NOTE — Lactation Note (Signed)
This note was copied from a baby's chart. Lactation Consultation Note  Patient Name: Emily Zhang [id number] Date: 11/20/2016 Reason for consult: Follow-up assessment   Maternal Data    Feeding Feeding Type: Breast Fed Length of feed: 6 min  LATCH Score/Interventions Latch: Grasps breast easily, tongue down, lips flanged, rhythmical sucking.  Audible Swallowing: Spontaneous and intermittent  Type of Nipple: Everted at rest and after stimulation Intervention(s): No intervention needed  Comfort (Breast/Nipple): Soft / non-tender     Hold (Positioning): No assistance needed to correctly position infant at breast.  LATCH Score: 10  Lactation Tools Discussed/Used     Consult Status Consult Status: Complete    Daryel November 11/20/2016, 12:55 PM

## 2016-11-20 NOTE — Progress Notes (Signed)
MD order for pt d/c home.  Pt and sig other given all d/c instructions and understands all including f/u with MD. Rx given to pt for Ibuprofen.  Discharged home via wheelchair with baby by auxillary.

## 2016-11-20 NOTE — Anesthesia Postprocedure Evaluation (Signed)
Anesthesia Post Note  Patient: Animator  Procedure(s) Performed: * No procedures listed *  Patient location during evaluation: Mother Baby Anesthesia Type: Epidural Level of consciousness: awake and alert Pain management: pain level controlled Vital Signs Assessment: post-procedure vital signs reviewed and stable Respiratory status: spontaneous breathing, nonlabored ventilation and respiratory function stable Cardiovascular status: stable Postop Assessment: no headache, no backache and epidural receding Anesthetic complications: no     Last Vitals:  Vitals:   11/19/16 1909 11/19/16 2357  BP: (!) 95/50 111/60  Pulse: 72 72  Resp: 20 20  Temp: 36.4 C 36.8 C    Last Pain:  Vitals:   11/20/16 0605  PainSc: 0-No pain                 Alison Stalling

## 2016-11-20 NOTE — Discharge Instructions (Signed)
Vaginal Delivery, Care After Refer to this sheet in the next few weeks. These discharge instructions provide you with information on caring for yourself after delivery. Your caregiver may also give you specific instructions. Your treatment has been planned according to the most current medical practices available, but problems sometimes occur. Call your caregiver if you have any problems or questions after you go home. HOME CARE INSTRUCTIONS 1. Take over-the-counter or prescription medicines only as directed by your caregiver or pharmacist. 2. Do not drink alcohol, especially if you are breastfeeding or taking medicine to relieve pain. 3. Do not smoke tobacco. 4. Continue to use good perineal care. Good perineal care includes: 1. Wiping your perineum from back to front 2. Keeping your perineum clean. 3. You can do sitz baths twice a day, to help keep this area clean 5. Do not use tampons, douche or have sex until your caregiver says it is okay. 6. Shower only and avoid sitting in submerged water, aside from sitz baths 7. Wear a well-fitting bra that provides breast support. 8. Eat healthy foods. 9. Drink enough fluids to keep your urine clear or pale yellow. 10. Eat high-fiber foods such as whole grain cereals and breads, brown rice, beans, and fresh fruits and vegetables every day. These foods may help prevent or relieve constipation. 11. Avoid constipation with high fiber foods or medications, such as miralax or metamucil 12. Follow your caregiver's recommendations regarding resumption of activities such as climbing stairs, driving, lifting, exercising, or traveling. 13. Talk to your caregiver about resuming sexual activities. Resumption of sexual activities is dependent upon your risk of infection, your rate of healing, and your comfort and desire to resume sexual activity. 14. Try to have someone help you with your household activities and your newborn for at least a few days after you leave  the hospital. 15. Rest as much as possible. Try to rest or take a nap when your newborn is sleeping. 16. Increase your activities gradually. 17. Keep all of your scheduled postpartum appointments. It is very important to keep your scheduled follow-up appointments. At these appointments, your caregiver will be checking to make sure that you are healing physically and emotionally. SEEK MEDICAL CARE IF:   You are passing large clots from your vagina. Save any clots to show your caregiver.  You have a foul smelling discharge from your vagina.  You have trouble urinating.  You are urinating frequently.  You have pain when you urinate.  You have a change in your bowel movements.  You have increasing redness, pain, or swelling near your vaginal incision (episiotomy) or vaginal tear.  You have pus draining from your episiotomy or vaginal tear.  Your episiotomy or vaginal tear is separating.  You have painful, hard, or reddened breasts.  You have a severe headache.  You have blurred vision or see spots.  You feel sad or depressed.  You have thoughts of hurting yourself or your newborn.  You have questions about your care, the care of your newborn, or medicines.  You are dizzy or light-headed.  You have a rash.  You have nausea or vomiting.  You were breastfeeding and have not had a menstrual period within 12 weeks after you stopped breastfeeding.  You are not breastfeeding and have not had a menstrual period by the 12th week after delivery.  You have a fever. SEEK IMMEDIATE MEDICAL CARE IF:   You have persistent pain.  You have chest pain.  You have shortness of breath.    You faint.  You have leg pain.  You have stomach pain.  Your vaginal bleeding saturates two or more sanitary pads in 1 hour. MAKE SURE YOU:   Understand these instructions.  Will watch your condition.  Will get help right away if you are not doing well or get worse. Document Released:  11/14/2000 Document Revised: 04/03/2014 Document Reviewed: 07/14/2012 [facility] Patient Information 2015 [place], [place]. This information is not intended to replace advice given to you by your health care provider. Make sure you discuss any questions you have with your health care provider.  Sitz Bath A sitz bath is a warm water bath taken in the sitting position. The water covers only the hips and butt (buttocks). We recommend using one that fits in the toilet, to help with ease of use and cleanliness. It may be used for either healing or cleaning purposes. Sitz baths are also used to relieve pain, itching, or muscle tightening (spasms). The water may contain medicine. Moist heat will help you heal and relax.  HOME CARE  Take 3 to 4 sitz baths a day. 18. Fill the bathtub half-full with warm water. 19. Sit in the water and open the drain a little. 20. Turn on the warm water to keep the tub half-full. Keep the water running constantly. 21. Soak in the water for 15 to 20 minutes. 22. After the sitz bath, pat the affected area dry. GET HELP RIGHT AWAY IF: You get worse instead of better. Stop the sitz baths if you get worse. MAKE SURE YOU:  Understand these instructions.  Will watch your condition.  Will get help right away if you are not doing well or get worse. Document Released: 12/25/2004 Document Revised: 08/11/2012 Document Reviewed: 03/17/2011 [facility] Patient Information 2015 [place], [place]. This information is not intended to replace advice given to you by your health care provider. Make sure you discuss any questions you have with your health care provider.  Ibuprofen tablets and capsules What is this medicine? IBUPROFEN (eye BYOO proe fen) is a non-steroidal anti-inflammatory drug (NSAID). It is used for dental pain, fever, headaches or migraines, osteoarthritis, rheumatoid arthritis, or painful monthly periods. It can also relieve minor aches and pains caused by a cold, flu, or  sore throat. This medicine may be used for other purposes; ask your health care provider or pharmacist if you have questions. COMMON BRAND NAME(S): Advil, Advil Junior Strength, Advil Migraine, Genpril, Ibren, IBU, Midol, Midol Cramps and Body Aches, Motrin, Motrin IB, Motrin Junior Strength, Motrin Migraine Pain, Samson-8, Toxicology Saliva Collection What should I tell my health care provider before I take this medicine? They need to know if you have any of these conditions: -asthma -cigarette smoker -drink more than 3 alcohol containing drinks a day -heart disease or circulation problems such as heart failure or leg edema (fluid retention) -high blood pressure -kidney disease -liver disease -stomach bleeding or ulcers -an unusual or allergic reaction to ibuprofen, aspirin, other NSAIDS, other medicines, foods, dyes, or preservatives -pregnant or trying to get pregnant -breast-feeding How should I use this medicine? Take this medicine by mouth with a glass of water. Follow the directions on the prescription label. Take this medicine with food if your stomach gets upset. Try to not lie down for at least 10 minutes after you take the medicine. Take your medicine at regular intervals. Do not take your medicine more often than directed. A special MedGuide will be given to you by the pharmacist with each prescription and refill.  Be sure to read this information carefully each time. Talk to your pediatrician regarding the use of this medicine in children. Special care may be needed. Overdosage: If you think you have taken too much of this medicine contact a poison control center or emergency room at once. NOTE: This medicine is only for you. Do not share this medicine with others. What if I miss a dose? If you miss a dose, take it as soon as you can. If it is almost time for your next dose, take only that dose. Do not take double or extra doses. What may interact with this medicine? Do not take  this medicine with any of the following medications: -cidofovir -ketorolac -methotrexate -pemetrexed This medicine may also interact with the following medications: -alcohol -aspirin -diuretics -lithium -other drugs for inflammation like prednisone -warfarin This list may not describe all possible interactions. Give your health care provider a list of all the medicines, herbs, non-prescription drugs, or dietary supplements you use. Also tell them if you smoke, drink alcohol, or use illegal drugs. Some items may interact with your medicine. What should I watch for while using this medicine? Tell your doctor or healthcare professional if your symptoms do not start to get better or if they get worse. This medicine does not prevent heart attack or stroke. In fact, this medicine may increase the chance of a heart attack or stroke. The chance may increase with longer use of this medicine and in people who have heart disease. If you take aspirin to prevent heart attack or stroke, talk with your doctor or health care professional. Do not take other medicines that contain aspirin, ibuprofen, or naproxen with this medicine. Side effects such as stomach upset, nausea, or ulcers may be more likely to occur. Many medicines available without a prescription should not be taken with this medicine. This medicine can cause ulcers and bleeding in the stomach and intestines at any time during treatment. Ulcers and bleeding can happen without warning symptoms and can cause death. To reduce your risk, do not smoke cigarettes or drink alcohol while you are taking this medicine. You may get drowsy or dizzy. Do not drive, use machinery, or do anything that needs mental alertness until you know how this medicine affects you. Do not stand or sit up quickly, especially if you are an older patient. This reduces the risk of dizzy or fainting spells. This medicine can cause you to bleed more easily. Try to avoid damage to your  teeth and gums when you brush or floss your teeth. This medicine may be used to treat migraines. If you take migraine medicines for 10 or more days a month, your migraines may get worse. Keep a diary of headache days and medicine use. Contact your healthcare professional if your migraine attacks occur more frequently. What side effects may I notice from receiving this medicine? Side effects that you should report to your doctor or health care professional as soon as possible: -allergic reactions like skin rash, itching or hives, swelling of the face, lips, or tongue -severe stomach pain -signs and symptoms of bleeding such as bloody or black, tarry stools; red or dark-brown urine; spitting up blood or brown material that looks like coffee grounds; red spots on the skin; unusual bruising or bleeding from the eye, gums, or nose -signs and symptoms of a blood clot such as changes in vision; chest pain; severe, sudden headache; trouble speaking; sudden numbness or weakness of the face, arm, or leg -unexplained  weight gain or swelling -unusually weak or tired -yellowing of eyes or skin Side effects that usually do not require medical attention (report to your doctor or health care professional if they continue or are bothersome): -bruising -diarrhea -dizziness, drowsiness -headache -nausea, vomiting This list may not describe all possible side effects. Call your doctor for medical advice about side effects. You may report side effects to FDA at 1-800-FDA-1088. Where should I keep my medicine? Keep out of the reach of children. Store at room temperature between 15 and 30 degrees C (59 and 86 degrees F). Keep container tightly closed. Throw away any unused medicine after the expiration date. NOTE: This sheet is a summary. It may not cover all possible information. If you have questions about this medicine, talk to your doctor, pharmacist, or health care provider.  2017 Elsevier/Gold Standard  (2013-07-19 10:48:02)

## 2017-02-03 ENCOUNTER — Telehealth: Payer: Self-pay

## 2017-02-03 NOTE — Telephone Encounter (Signed)
Pt delivered 11/19/16 and breastfed for approx one month. Pt states she had planned to pick up birth control today but has not yet had a period and during intercourse last night had a condom break. Advised pt she could either wait for period, take plan B or wait for negative pregnancy test. Pt decided on plan B to aid in contraception.

## 2017-02-18 ENCOUNTER — Telehealth: Payer: Self-pay

## 2017-02-18 NOTE — Telephone Encounter (Signed)
Pt delivered 11/19/16. She started her OCP & started her 1st menses in the middle of her pack. Pt inquiring if she should continue meds or stop and restart after menses over. [phone].

## 2017-02-18 NOTE — Telephone Encounter (Signed)
Spoke w/pt. Notified to continue OCP until complete. Break thru bleeding normal. Can take 3-4 mos to regulate. If continues past 3-4 mos, can discuss OCP change.

## 2017-05-01 DIAGNOSIS — M779 Enthesopathy, unspecified: Secondary | ICD-10-CM

## 2017-05-01 HISTORY — DX: Enthesopathy, unspecified: M77.9

## 2017-05-04 ENCOUNTER — Telehealth: Payer: Self-pay | Admitting: General Practice

## 2017-05-04 NOTE — Telephone Encounter (Signed)
Pt is requesting a new patient appointment.  Pt has been being seen at [facility] due to having a baby.  Pt has not had PCP in a few years.  [employer] insurance.  CB#[phone]/MW

## 2017-05-05 NOTE — Telephone Encounter (Signed)
Left message/MW

## 2017-05-05 NOTE — Telephone Encounter (Signed)
Yes, she may be scheduled. Thank you.

## 2017-05-06 NOTE — Telephone Encounter (Signed)
Left message/MW

## 2017-05-08 NOTE — Telephone Encounter (Signed)
Appointment scheduled for 06/05/17@10 /MW

## 2017-05-11 DIAGNOSIS — M5411 Radiculopathy, occipito-atlanto-axial region: Secondary | ICD-10-CM | POA: Diagnosis not present

## 2017-05-11 DIAGNOSIS — M5413 Radiculopathy, cervicothoracic region: Secondary | ICD-10-CM | POA: Diagnosis not present

## 2017-05-11 DIAGNOSIS — M5412 Radiculopathy, cervical region: Secondary | ICD-10-CM | POA: Diagnosis not present

## 2017-05-19 DIAGNOSIS — M5411 Radiculopathy, occipito-atlanto-axial region: Secondary | ICD-10-CM | POA: Diagnosis not present

## 2017-05-19 DIAGNOSIS — M5412 Radiculopathy, cervical region: Secondary | ICD-10-CM | POA: Diagnosis not present

## 2017-05-19 DIAGNOSIS — M5413 Radiculopathy, cervicothoracic region: Secondary | ICD-10-CM | POA: Diagnosis not present

## 2017-05-22 ENCOUNTER — Encounter: Payer: Self-pay | Admitting: Certified Nurse Midwife

## 2017-05-22 ENCOUNTER — Ambulatory Visit (INDEPENDENT_AMBULATORY_CARE_PROVIDER_SITE_OTHER): Payer: 59 | Admitting: Certified Nurse Midwife

## 2017-05-22 VITALS — BP 120/70 | HR 68 | Ht 63.0 in | Wt 231.0 lb

## 2017-05-22 DIAGNOSIS — Z308 Encounter for other contraceptive management: Secondary | ICD-10-CM

## 2017-05-22 MED ORDER — LEVONORGESTREL-ETHINYL ESTRAD 0.15-30 MG-MCG PO TABS: 1.0000 | ORAL_TABLET | Freq: Every day | ORAL | 11 refills | Status: DC

## 2017-05-22 NOTE — Progress Notes (Signed)
  History of Present Illness:  Emily Zhang is a 31 y.o. who was started on Necon 1/35 generic approximately 4 months ago. Since that time, she states that her bleeding is regular and normal flow. Denies BTB, headaches, nausea, but has been very irritable on these pills. She would like to switch to a different pill.  PMHx: She  has a past medical history of Allergic rhinitis; Bone spur (05/2017); Medical history non-contributory; Obesity; and Ovarian cyst. Also,  has a past surgical history that includes Dental surgery., family history includes Colon cancer (age of onset: 38) in her maternal uncle; Diabetes in her paternal grandfather; Endometriosis in her cousin; Heart disease in her maternal grandfather; Lung cancer in her maternal grandfather; Melanoma (age of onset: 64) in her maternal grandmother.,  reports that she has quit smoking. She has never used smokeless tobacco. She reports that she does not drink alcohol or use drugs.  She has a current medication list which includes the following prescription(s): multivitamin and norethindrone-ethinyl estradiol 1/35. Also, is allergic to vicodin [hydrocodone-acetaminophen].  Review of Systems  Constitutional: Negative for chills and fever.       Positive for 22# weight gain since her 6 week check up.  Respiratory: Negative for cough.   Cardiovascular: Negative for chest pain and palpitations.  Gastrointestinal: Negative for abdominal pain, nausea and vomiting.  Neurological: Negative for headaches.  Psychiatric/Behavioral:       Positive for irritability and mood swings    Physical Exam:  BP 120/70   Pulse 68   Ht 5\' 3"  (1.6 m)   Wt 231 lb (104.8 kg)   LMP 04/27/2017 (Exact Date)   BMI 40.92 kg/m  Body mass index is 40.92 kg/m. Constitutional: Well nourished, well developed female in no acute distress.   Neuro: Grossly intact Psych:  Normal mood and affect.    Assessment: Mood swings and irritability on current pills. Will  try a different formulation  Plan: Will switch to [facility] BCPS  She was amenable to this plan and we will see her back if she has any problems with this pill.  Dalia Heading, CNM [facility], [facility] 05/22/2017  12:15 PM

## 2017-05-25 ENCOUNTER — Other Ambulatory Visit: Payer: Self-pay

## 2017-05-25 DIAGNOSIS — M5411 Radiculopathy, occipito-atlanto-axial region: Secondary | ICD-10-CM | POA: Diagnosis not present

## 2017-05-25 DIAGNOSIS — M5413 Radiculopathy, cervicothoracic region: Secondary | ICD-10-CM | POA: Diagnosis not present

## 2017-05-25 DIAGNOSIS — M5412 Radiculopathy, cervical region: Secondary | ICD-10-CM | POA: Diagnosis not present

## 2017-05-25 NOTE — Telephone Encounter (Signed)
Confirmed pharmacy with patient and contacted pharmacy to make sure it had been received. Pt aware rx was sent on 6/22 and received by pharmacy.

## 2017-05-25 NOTE — Telephone Encounter (Signed)
Pt states pharm doesn't have new rx of diff form of bc.  Please call.   [phone]

## 2017-05-27 DIAGNOSIS — M5413 Radiculopathy, cervicothoracic region: Secondary | ICD-10-CM | POA: Diagnosis not present

## 2017-05-27 DIAGNOSIS — M5411 Radiculopathy, occipito-atlanto-axial region: Secondary | ICD-10-CM | POA: Diagnosis not present

## 2017-05-27 DIAGNOSIS — M5412 Radiculopathy, cervical region: Secondary | ICD-10-CM | POA: Diagnosis not present

## 2017-05-29 NOTE — Telephone Encounter (Signed)
Pt states her old bc was necon and new is portia.  Called [facility] on [address] and spoke c [place] who states they had a generic of portia ready for pt to p/u.  Pt aware.

## 2017-05-29 NOTE — Telephone Encounter (Signed)
This encounter was created in error - please disregard.

## 2017-05-29 NOTE — Telephone Encounter (Signed)
Pt is calling to speak to [place]. Please call

## 2017-06-05 ENCOUNTER — Ambulatory Visit (INDEPENDENT_AMBULATORY_CARE_PROVIDER_SITE_OTHER): Payer: 59 | Admitting: Physician Assistant

## 2017-06-05 ENCOUNTER — Encounter: Payer: Self-pay | Admitting: Physician Assistant

## 2017-06-05 VITALS — BP 104/68 | HR 88

## 2017-06-05 DIAGNOSIS — L723 Sebaceous cyst: Secondary | ICD-10-CM | POA: Diagnosis not present

## 2017-06-05 DIAGNOSIS — Z0000 Encounter for general adult medical examination without abnormal findings: Secondary | ICD-10-CM

## 2017-06-05 DIAGNOSIS — F329 Major depressive disorder, single episode, unspecified: Secondary | ICD-10-CM | POA: Diagnosis not present

## 2017-06-05 DIAGNOSIS — R4589 Other symptoms and signs involving emotional state: Secondary | ICD-10-CM | POA: Insufficient documentation

## 2017-06-05 NOTE — Progress Notes (Signed)
Patient: Emily Zhang Female    DOB: [birth year]   31 y.o.   MRN: [id number] Visit Date: 06/05/2017  Today's Provider: Trinna Post, PA-C   Chief Complaint  Patient presents with  . Establish Care  . Depression   Subjective:      Emily Zhang is a 31 y/o woman presenting today to establish care. She has never had a primary care provider, just went to acute care clinics.  She lives in [place] with her husband of five years. She has a 33 year old son. She has a 33 month old daughter.   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 She is the primary caretaker of her 33 month old daughter. She is having a bit of a tough time adjusting. She doesn't like her job, her husband works two jobs.   She is having some emotional issues. She is having crying spells. She is not thinking of harming herself or her baby. Taking care of her baby okay. She is working on changing her birth control. Open to depression medication, just not right now.   She has a family history of melanoma. No family history of breast cancer. Gets her Paps at [facility]. She has a history of colon cancer in her maternal uncle at age 46. Mother did not get screened.  She has a recurrent cyst on her groin that swells and lets out pus sometimes. Happens in the same area.   Depression         This is a new problem.  The current episode started more than 1 month ago.   Associated symptoms include decreased concentration, fatigue and decreased interest.  Associated symptoms include no helplessness, no hopelessness, does not have insomnia, not irritable, no restlessness, no appetite change, no body aches, no myalgias, no headaches, no indigestion, not sad and no suicidal ideas.     The symptoms are aggravated by work stress.     Allergies  Allergen Reactions  . Vicodin [Hydrocodone-Acetaminophen] Rash     Current Outpatient Prescriptions:  .   levonorgestrel-ethinyl estradiol (PORTIA-28) 0.15-30 MG-MCG tablet, Take 1 tablet by mouth daily., Disp: 1 Package, Rfl: 11 .  Multiple Vitamin (MULTIVITAMIN) tablet, Take 1 tablet by mouth daily., Disp: , Rfl:   Review of Systems  Constitutional: Positive for fatigue. Negative for appetite change.  HENT: Negative.   Eyes: Negative.   Respiratory: Negative.   Cardiovascular: Negative.   Gastrointestinal: Negative.   Endocrine: Negative.   Genitourinary: Negative.   Musculoskeletal: Negative.  Negative for myalgias.  Skin: Negative.   Allergic/Immunologic: Negative.   Neurological: Negative.  Negative for headaches.  Hematological: Negative.   Psychiatric/Behavioral: Positive for decreased concentration and depression. Negative for suicidal ideas. The patient is nervous/anxious. The patient does not have insomnia.     Social History  Substance Use Topics  . Smoking status: Former Research scientist (life sciences)  . Smokeless tobacco: Never Used  . Alcohol use No   Objective:   There were no vitals taken for this visit. There were no vitals filed for this visit.   Physical Exam  Constitutional: She is oriented to person, place, and time. She appears well-developed and well-nourished. She is not irritable.  Cardiovascular: Normal rate and regular rhythm.   Pulmonary/Chest: Effort normal and breath sounds normal.  Abdominal: Soft. Bowel sounds are normal.  Neurological: She is alert and oriented to person, place, and time.  Skin: Skin  is warm and dry.  Sebaceous cyst in groin  Psychiatric: Her behavior is normal. She exhibits a depressed mood.  Occasionally teary eyed during exam.         Assessment & Plan:     1. Annual physical exam  - Comprehensive metabolic panel - CBC with Differential/Platelet - TSH - Lipid panel  2. Depressed mood  She is changing her OCP and wants to see if this works. She is open to depression medication but not right now.   3. Sebaceous cyst  Counseled patient  that we can spot treat but to permanently remove will need surgery consult.  Return in about 1 year (around 06/05/2018) for CPE.  The entirety of the information documented in the History of Present Illness, Review of Systems and Physical Exam were personally obtained by me. Portions of this information were initially documented by Ashley Royalty, CMA and reviewed by me for thoroughness and accuracy.        Trinna Post, PA-C  [facility]

## 2017-06-05 NOTE — Patient Instructions (Signed)

## 2017-06-08 ENCOUNTER — Telehealth: Payer: Self-pay

## 2017-06-08 DIAGNOSIS — M5413 Radiculopathy, cervicothoracic region: Secondary | ICD-10-CM | POA: Diagnosis not present

## 2017-06-08 DIAGNOSIS — M5412 Radiculopathy, cervical region: Secondary | ICD-10-CM | POA: Diagnosis not present

## 2017-06-08 DIAGNOSIS — M5411 Radiculopathy, occipito-atlanto-axial region: Secondary | ICD-10-CM | POA: Diagnosis not present

## 2017-06-08 NOTE — Telephone Encounter (Signed)
Pt and her family had the stomach bug last week c diarrhea and vomiting.  Wants to know how long to use backup bc.  [phone].  Adv to use condoms until next period.

## 2017-06-11 DIAGNOSIS — M5413 Radiculopathy, cervicothoracic region: Secondary | ICD-10-CM | POA: Diagnosis not present

## 2017-06-11 DIAGNOSIS — M5412 Radiculopathy, cervical region: Secondary | ICD-10-CM | POA: Diagnosis not present

## 2017-06-11 DIAGNOSIS — M5411 Radiculopathy, occipito-atlanto-axial region: Secondary | ICD-10-CM | POA: Diagnosis not present

## 2017-06-15 ENCOUNTER — Telehealth: Payer: Self-pay

## 2017-06-15 DIAGNOSIS — M5412 Radiculopathy, cervical region: Secondary | ICD-10-CM | POA: Diagnosis not present

## 2017-06-15 DIAGNOSIS — M5411 Radiculopathy, occipito-atlanto-axial region: Secondary | ICD-10-CM | POA: Diagnosis not present

## 2017-06-15 DIAGNOSIS — M5413 Radiculopathy, cervicothoracic region: Secondary | ICD-10-CM | POA: Diagnosis not present

## 2017-06-15 NOTE — Telephone Encounter (Signed)
Pt states she is still going rounds w/pharmacy trying to p/u her new birth control. Pt states [facility] had called in [place]. They still haven't rcvd it even tho she knows someone from [facility] has called twice & spoke to someone at pharmacy. They still can't find it. TC#[phone].

## 2017-06-15 NOTE — Telephone Encounter (Signed)
Duplicate encounter created in error.

## 2017-06-15 NOTE — Telephone Encounter (Signed)
Pt aware rx available at pharmacy. Electronically sent as Emily Zhang and pt is calling pharmacy to request rx for [employer]. Two accounts created at pharmacy. Account updated to reflect current rx valid through June 2019

## 2017-06-18 DIAGNOSIS — M5412 Radiculopathy, cervical region: Secondary | ICD-10-CM | POA: Diagnosis not present

## 2017-06-18 DIAGNOSIS — M5411 Radiculopathy, occipito-atlanto-axial region: Secondary | ICD-10-CM | POA: Diagnosis not present

## 2017-06-18 DIAGNOSIS — M5413 Radiculopathy, cervicothoracic region: Secondary | ICD-10-CM | POA: Diagnosis not present

## 2017-06-22 DIAGNOSIS — M5412 Radiculopathy, cervical region: Secondary | ICD-10-CM | POA: Diagnosis not present

## 2017-06-22 DIAGNOSIS — M5411 Radiculopathy, occipito-atlanto-axial region: Secondary | ICD-10-CM | POA: Diagnosis not present

## 2017-06-22 DIAGNOSIS — M5413 Radiculopathy, cervicothoracic region: Secondary | ICD-10-CM | POA: Diagnosis not present

## 2017-06-25 DIAGNOSIS — M5411 Radiculopathy, occipito-atlanto-axial region: Secondary | ICD-10-CM | POA: Diagnosis not present

## 2017-06-25 DIAGNOSIS — M5413 Radiculopathy, cervicothoracic region: Secondary | ICD-10-CM | POA: Diagnosis not present

## 2017-06-25 DIAGNOSIS — M5412 Radiculopathy, cervical region: Secondary | ICD-10-CM | POA: Diagnosis not present

## 2017-06-26 ENCOUNTER — Encounter: Payer: Self-pay | Admitting: Physician Assistant

## 2017-06-26 ENCOUNTER — Ambulatory Visit (INDEPENDENT_AMBULATORY_CARE_PROVIDER_SITE_OTHER): Payer: 59 | Admitting: Physician Assistant

## 2017-06-26 VITALS — BP 110/70 | HR 78 | Temp 98.4°F | Resp 16 | Wt 229.8 lb

## 2017-06-26 DIAGNOSIS — K645 Perianal venous thrombosis: Secondary | ICD-10-CM | POA: Diagnosis not present

## 2017-06-26 NOTE — Progress Notes (Signed)
       Patient: Emily Zhang Female    DOB: 1986/05/25   31 y.o.   MRN: [id number] Visit Date: 06/26/2017  Today's Provider: Mar Daring, PA-C   Chief Complaint  Patient presents with  . Hemorrhoids   Subjective:    HPI Hemorrhoids: Patient complains of evaluation of possible hemorrhoids. Onset of symptoms was abrupt starting 5 days ago ago with gradually worsening course since that time.  She describes symptoms as pain with sitting and painful defecation. Patient with family hx of colorectal CA, uncle from mother side. No recent anal intercourse.Treatments: Tylenol,Motrin and Aleve.       Allergies  Allergen Reactions  . Vicodin [Hydrocodone-Acetaminophen] Rash     Current Outpatient Prescriptions:  Marland Kitchen  Multiple Vitamin (MULTIVITAMIN) tablet, Take 1 tablet by mouth daily., Disp: , Rfl:  .  levonorgestrel-ethinyl estradiol (PORTIA-28) 0.15-30 MG-MCG tablet, Take 1 tablet by mouth daily. (Patient not taking: Reported on 06/26/2017), Disp: 1 Package, Rfl: 11  Review of Systems  Constitutional: Negative.   Respiratory: Negative.   Cardiovascular: Negative for chest pain, palpitations and leg swelling.  Gastrointestinal: Positive for abdominal distention, constipation, nausea and rectal pain. Negative for abdominal pain.  Genitourinary: Negative.   Musculoskeletal: Negative for back pain.    Social History  Substance Use Topics  . Smoking status: Former Research scientist (life sciences)  . Smokeless tobacco: Never Used  . Alcohol use No   Objective:   BP 110/70 (BP Location: Left Arm, Patient Position: Sitting, Cuff Size: Large)   Pulse 78   Temp 98.4 F (36.9 C) (Oral)   Resp 16   Wt 229 lb 12.8 oz (104.2 kg)   LMP 06/24/2017   BMI 40.71 kg/m     Physical Exam  Constitutional: She appears well-developed and well-nourished.  Abdominal: Soft. Bowel sounds are normal. She exhibits no distension and no mass. There is no tenderness. There is no rebound and no guarding.    Genitourinary: Rectal exam shows external hemorrhoid and tenderness.  Psychiatric: Her mood appears anxious.  Vitals reviewed.      Assessment & Plan:     1. External hemorrhoid, thrombosed Thrombectomy performed on external hemorrhoid (see procedure note below). Advised patient to use warm compresses and Sitz baths. May continue preparation H tomorrow. Call if symptoms return or worsen.  Procedure Note: Procedure, benefits and risks (including bleeding, infection and allergic reaction) and alternatives were explained to the patient. All questions were sought and answered. Verbal consent was obtained.  The area was then prepped with betadine swabs and draped in a sterile fashion. Local anesthetic was administered with 1.0cc of 2% xylocaine with epinephrine. An incision was then made using a size 10 scalpel blade. The area was then drained until no more thrombosed material could be expelled. The area was then cleaned and covered with a dry dressing. There were no complications and patient tolerated well. Wound cleansing instructions and signs of infection were verbally given to the patient.       Mar Daring, PA-C  [facility]

## 2017-06-26 NOTE — Patient Instructions (Signed)
Hemorrhoids    Hemorrhoids are swollen veins in and around the rectum or anus. Hemorrhoids can cause pain, itching, or bleeding. Most of the time, they do not cause serious problems. They usually get better with diet changes, lifestyle changes, and other home treatments.  Follow these instructions at home:  Eating and drinking  · Eat foods that have fiber, such as whole grains, beans, nuts, fruits, and vegetables. Ask your doctor about taking products that have added fiber (fiber supplements).  · Drink enough fluid to keep your pee (urine) clear or pale yellow.  For Pain and Swelling  · Take a warm-water bath (sitz bath) for 20 minutes to ease pain. Do this 3-4 times a day.  · If directed, put ice on the painful area. It may be helpful to use ice between your warm baths.  ¨ Put ice in a plastic bag.  ¨ Place a towel between your skin and the bag.  ¨ Leave the ice on for 20 minutes, 2-3 times a day.  General instructions  · Take over-the-counter and prescription medicines only as told by your doctor.  ¨ Medicated creams and medicines that are inserted into the anus (suppositories) may be used or applied as told.  · Exercise often.  · Go to the bathroom when you have the urge to poop (to have a bowel movement). Do not wait.  · Avoid pushing too hard (straining) when you poop.  · Keep the butt area dry and clean. Use wet toilet paper or moist paper towels.  · Do not sit on the toilet for a long time.  Contact a doctor if:  · You have any of these:  ¨ Pain and swelling that do not get better with treatment or medicine.  ¨ Bleeding that will not stop.  ¨ Trouble pooping or you cannot poop.  ¨ Pain or swelling outside the area of the hemorrhoids.  This information is not intended to replace advice given to you by your health care provider. Make sure you discuss any questions you have with your health care provider.  Document Released: 08/26/2008 Document Revised: 04/24/2016 Document Reviewed: 08/01/2015  Elsevier  Interactive Patient Education © 2018 Elsevier Inc.   

## 2017-06-29 DIAGNOSIS — M5413 Radiculopathy, cervicothoracic region: Secondary | ICD-10-CM | POA: Diagnosis not present

## 2017-06-29 DIAGNOSIS — M5411 Radiculopathy, occipito-atlanto-axial region: Secondary | ICD-10-CM | POA: Diagnosis not present

## 2017-06-29 DIAGNOSIS — M5412 Radiculopathy, cervical region: Secondary | ICD-10-CM | POA: Diagnosis not present

## 2017-07-03 DIAGNOSIS — M5413 Radiculopathy, cervicothoracic region: Secondary | ICD-10-CM | POA: Diagnosis not present

## 2017-07-03 DIAGNOSIS — M5411 Radiculopathy, occipito-atlanto-axial region: Secondary | ICD-10-CM | POA: Diagnosis not present

## 2017-07-03 DIAGNOSIS — M5412 Radiculopathy, cervical region: Secondary | ICD-10-CM | POA: Diagnosis not present

## 2017-07-06 DIAGNOSIS — M5412 Radiculopathy, cervical region: Secondary | ICD-10-CM | POA: Diagnosis not present

## 2017-07-06 DIAGNOSIS — M5411 Radiculopathy, occipito-atlanto-axial region: Secondary | ICD-10-CM | POA: Diagnosis not present

## 2017-07-06 DIAGNOSIS — M5413 Radiculopathy, cervicothoracic region: Secondary | ICD-10-CM | POA: Diagnosis not present

## 2017-07-07 DIAGNOSIS — M5413 Radiculopathy, cervicothoracic region: Secondary | ICD-10-CM | POA: Diagnosis not present

## 2017-07-07 DIAGNOSIS — M5411 Radiculopathy, occipito-atlanto-axial region: Secondary | ICD-10-CM | POA: Diagnosis not present

## 2017-07-07 DIAGNOSIS — M5412 Radiculopathy, cervical region: Secondary | ICD-10-CM | POA: Diagnosis not present

## 2017-07-13 DIAGNOSIS — M5413 Radiculopathy, cervicothoracic region: Secondary | ICD-10-CM | POA: Diagnosis not present

## 2017-07-13 DIAGNOSIS — M5412 Radiculopathy, cervical region: Secondary | ICD-10-CM | POA: Diagnosis not present

## 2017-07-13 DIAGNOSIS — M5411 Radiculopathy, occipito-atlanto-axial region: Secondary | ICD-10-CM | POA: Diagnosis not present

## 2017-08-12 DIAGNOSIS — M25561 Pain in right knee: Secondary | ICD-10-CM | POA: Diagnosis not present

## 2017-08-12 DIAGNOSIS — S96911A Strain of unspecified muscle and tendon at ankle and foot level, right foot, initial encounter: Secondary | ICD-10-CM | POA: Diagnosis not present

## 2017-08-19 DIAGNOSIS — M5411 Radiculopathy, occipito-atlanto-axial region: Secondary | ICD-10-CM | POA: Diagnosis not present

## 2017-08-19 DIAGNOSIS — M5413 Radiculopathy, cervicothoracic region: Secondary | ICD-10-CM | POA: Diagnosis not present

## 2017-08-19 DIAGNOSIS — M5412 Radiculopathy, cervical region: Secondary | ICD-10-CM | POA: Diagnosis not present

## 2017-08-20 DIAGNOSIS — M5411 Radiculopathy, occipito-atlanto-axial region: Secondary | ICD-10-CM | POA: Diagnosis not present

## 2017-08-20 DIAGNOSIS — M5412 Radiculopathy, cervical region: Secondary | ICD-10-CM | POA: Diagnosis not present

## 2017-08-20 DIAGNOSIS — M5413 Radiculopathy, cervicothoracic region: Secondary | ICD-10-CM | POA: Diagnosis not present

## 2017-08-28 DIAGNOSIS — M65872 Other synovitis and tenosynovitis, left ankle and foot: Secondary | ICD-10-CM | POA: Diagnosis not present

## 2017-08-28 DIAGNOSIS — S93492S Sprain of other ligament of left ankle, sequela: Secondary | ICD-10-CM | POA: Diagnosis not present

## 2017-08-28 DIAGNOSIS — M25572 Pain in left ankle and joints of left foot: Secondary | ICD-10-CM | POA: Diagnosis not present

## 2017-09-23 DIAGNOSIS — M7672 Peroneal tendinitis, left leg: Secondary | ICD-10-CM | POA: Diagnosis not present

## 2017-09-24 DIAGNOSIS — S93492S Sprain of other ligament of left ankle, sequela: Secondary | ICD-10-CM | POA: Diagnosis not present

## 2017-09-24 DIAGNOSIS — S86312A Strain of muscle(s) and tendon(s) of peroneal muscle group at lower leg level, left leg, initial encounter: Secondary | ICD-10-CM | POA: Diagnosis not present

## 2017-09-24 DIAGNOSIS — M7672 Peroneal tendinitis, left leg: Secondary | ICD-10-CM | POA: Diagnosis not present

## 2017-12-01 DIAGNOSIS — M1990 Unspecified osteoarthritis, unspecified site: Secondary | ICD-10-CM

## 2017-12-01 HISTORY — DX: Unspecified osteoarthritis, unspecified site: M19.90

## 2018-03-21 ENCOUNTER — Emergency Department: Payer: 59

## 2018-03-21 ENCOUNTER — Emergency Department
Admission: EM | Admit: 2018-03-21 | Discharge: 2018-03-22 | Disposition: A | Discharge status: Home / Self Care | Payer: 59 | Attending: Emergency Medicine | Admitting: Emergency Medicine

## 2018-03-21 ENCOUNTER — Encounter: Payer: Self-pay | Admitting: Emergency Medicine

## 2018-03-21 ENCOUNTER — Other Ambulatory Visit: Payer: Self-pay

## 2018-03-21 DIAGNOSIS — R1011 Right upper quadrant pain: Secondary | ICD-10-CM

## 2018-03-21 DIAGNOSIS — R197 Diarrhea, unspecified: Secondary | ICD-10-CM | POA: Diagnosis not present

## 2018-03-21 DIAGNOSIS — Z79899 Other long term (current) drug therapy: Secondary | ICD-10-CM | POA: Insufficient documentation

## 2018-03-21 DIAGNOSIS — F17200 Nicotine dependence, unspecified, uncomplicated: Secondary | ICD-10-CM | POA: Diagnosis not present

## 2018-03-21 LAB — URINALYSIS, COMPLETE (UACMP) WITH MICROSCOPIC
BILIRUBIN URINE: NEGATIVE
Glucose, UA: NEGATIVE mg/dL
HGB URINE DIPSTICK: NEGATIVE
KETONES UR: NEGATIVE mg/dL
Leukocytes, UA: NEGATIVE
NITRITE: NEGATIVE
PROTEIN: NEGATIVE mg/dL
Specific Gravity, Urine: 1.028 (ref 1.005–1.030)
pH: 5 (ref 5.0–8.0)

## 2018-03-21 LAB — COMPREHENSIVE METABOLIC PANEL
ALK PHOS: 112 U/L (ref 38–126)
ALT: 29 U/L (ref 14–54)
ANION GAP: 8 (ref 5–15)
AST: 24 U/L (ref 15–41)
Albumin: 4.2 g/dL (ref 3.5–5.0)
BUN: 10 mg/dL (ref 6–20)
CALCIUM: 9 mg/dL (ref 8.9–10.3)
CHLORIDE: 104 mmol/L (ref 101–111)
CO2: 23 mmol/L (ref 22–32)
Creatinine, Ser: 0.68 mg/dL (ref 0.44–1.00)
GFR calc non Af Amer: >60 mL/min (ref >60)
Glucose, Bld: 118 mg/dL — ABNORMAL HIGH (ref 65–99)
Potassium: 3.8 mmol/L (ref 3.5–5.1)
Sodium: 135 mmol/L (ref 135–145)
Total Bilirubin: 0.8 mg/dL (ref 0.3–1.2)
Total Protein: 7.5 g/dL (ref 6.5–8.1)

## 2018-03-21 LAB — CBC
HCT: 43.1 % (ref 35.0–47.0)
HEMOGLOBIN: 14.9 g/dL (ref 12.0–16.0)
MCH: 31.2 pg (ref 26.0–34.0)
MCHC: 34.5 g/dL (ref 32.0–36.0)
MCV: 90.6 fL (ref 80.0–100.0)
Platelets: 266 10*3/uL (ref 150–440)
RBC: 4.76 MIL/uL (ref 3.80–5.20)
RDW: 12.9 % (ref 11.5–14.5)
WBC: 7.3 10*3/uL (ref 3.6–11.0)

## 2018-03-21 LAB — POCT PREGNANCY, URINE: PREG TEST UR: NEGATIVE

## 2018-03-21 LAB — LIPASE, BLOOD: LIPASE: 26 U/L (ref 11–51)

## 2018-03-21 MED ORDER — SODIUM CHLORIDE 0.9 % IV BOLUS
1000.0000 mL | Freq: Once | INTRAVENOUS | Status: AC
  Administered 2018-03-21: 1000 mL via INTRAVENOUS

## 2018-03-21 MED ORDER — ONDANSETRON 4 MG PO TBDP: 4.0000 mg | ORAL_TABLET | Freq: Three times a day (TID) | ORAL | 0 refills | Status: DC | PRN

## 2018-03-21 MED ORDER — PROMETHAZINE HCL 25 MG PO TABS: 25.0000 mg | ORAL_TABLET | Freq: Four times a day (QID) | ORAL | 0 refills | Status: DC | PRN

## 2018-03-21 MED ORDER — ONDANSETRON HCL 4 MG/2ML IJ SOLN
INTRAMUSCULAR | Status: AC
  Filled 2018-03-21: qty 2

## 2018-03-21 MED ORDER — PROMETHAZINE HCL 25 MG/ML IJ SOLN
INTRAMUSCULAR | Status: AC
  Filled 2018-03-21: qty 1

## 2018-03-21 MED ORDER — FAMOTIDINE 20 MG PO TABS: 20.0000 mg | ORAL_TABLET | Freq: Two times a day (BID) | ORAL | 0 refills | Status: DC

## 2018-03-21 MED ORDER — ONDANSETRON HCL 4 MG/2ML IJ SOLN
4.0000 mg | Freq: Once | INTRAMUSCULAR | Status: AC
  Administered 2018-03-21: 4 mg via INTRAVENOUS

## 2018-03-21 MED ORDER — ONDANSETRON 4 MG PO TBDP
4.0000 mg | ORAL_TABLET | Freq: Once | ORAL | Status: AC | PRN
  Administered 2018-03-21: 4 mg via ORAL
  Filled 2018-03-21: qty 1

## 2018-03-21 MED ORDER — LOPERAMIDE HCL 2 MG PO TABS: 4.0000 mg | ORAL_TABLET | Freq: Four times a day (QID) | ORAL | 0 refills | Status: DC | PRN

## 2018-03-21 MED ORDER — LOPERAMIDE HCL 2 MG PO CAPS
4.0000 mg | ORAL_CAPSULE | Freq: Once | ORAL | Status: AC
  Administered 2018-03-21: 4 mg via ORAL

## 2018-03-21 MED ORDER — ONDANSETRON HCL 4 MG/2ML IJ SOLN
4.0000 mg | Freq: Once | INTRAMUSCULAR | Status: AC
  Administered 2018-03-21: 4 mg via INTRAVENOUS
  Filled 2018-03-21: qty 2

## 2018-03-21 MED ORDER — LOPERAMIDE HCL 2 MG PO CAPS
ORAL_CAPSULE | ORAL | Status: AC
  Filled 2018-03-21: qty 2

## 2018-03-21 MED ORDER — PROMETHAZINE HCL 25 MG/ML IJ SOLN
25.0000 mg | Freq: Once | INTRAMUSCULAR | Status: AC
  Administered 2018-03-21: 25 mg via INTRAVENOUS

## 2018-03-21 NOTE — ED Provider Notes (Signed)
[facility] Emergency Department Provider Note  ____________________________________________  Time seen: Approximately 9:44 PM  I have reviewed the triage vital signs and the nursing notes.   HISTORY  Chief Complaint Abdominal Pain    HPI Sari Cogan Pereyra is a 32 y.o. female with a history of ovarian cyst who is in her usual state at health at bedtime last night and then woke up at 2 AM today with right upper quadrant abdominal pain and liquid bowel movement. She asked me she's by had 30 liquid bowel movements today. She has nausea but no vomiting. The pain radiates around to her right flank. No fever or chills. Worse with movement, no alleviating factors. Pain is constant, waxing and waning, moderate severity.      Past Medical History:  Diagnosis Date  . Allergic rhinitis   . Bone spur 05/2017   neck  . Medical history non-contributory   . Obesity   . Ovarian cyst      Patient Active Problem List   Diagnosis Date Noted  . Depressed mood 06/05/2017  . Sebaceous cyst 06/05/2017  . Postpartum care following vaginal delivery 11/19/2016     Past Surgical History:  Procedure Laterality Date  . DENTAL SURGERY       Prior to Admission medications   Medication Sig Start Date End Date Taking? Authorizing Provider  famotidine (PEPCID) 20 MG tablet Take 1 tablet (20 mg total) by mouth 2 (two) times daily. 03/21/18   Carrie Mew, MD  levonorgestrel-ethinyl estradiol [phone]) 0.15-30 MG-MCG tablet Take 1 tablet by mouth daily. Patient not taking: Reported on 06/26/2017 05/22/17   Dalia Heading, CNM  loperamide (IMODIUM A-D) 2 MG tablet Take 2 tablets (4 mg total) by mouth 4 (four) times daily as needed for diarrhea or loose stools. 03/21/18   Carrie Mew, MD  Multiple Vitamin (MULTIVITAMIN) tablet Take 1 tablet by mouth daily.    [provider]  ondansetron (ZOFRAN ODT) 4 MG disintegrating tablet Take 1 tablet (4 mg  total) by mouth every 8 (eight) hours as needed for nausea or vomiting. 03/21/18   Carrie Mew, MD     Allergies Vicodin [hydrocodone-acetaminophen]   Family History  Problem Relation Age of Onset  . Colon cancer Maternal Uncle 72  . Melanoma Maternal Grandmother 68  . Lung cancer Maternal Grandfather   . Heart disease Maternal Grandfather   . Diabetes Paternal Grandfather   . Endometriosis Cousin     Social History Social History   Tobacco Use  . Smoking status: Light Tobacco Smoker  . Smokeless tobacco: Never Used  Substance Use Topics  . Alcohol use: No  . Drug use: No    Review of Systems  Constitutional:   No fever or chills.  Cardiovascular:   No chest pain or syncope. Respiratory:   No dyspnea or cough. Gastrointestinal:   positive abdominal pain and diarrhea. No vomiting. Musculoskeletal:   Negative for focal pain or swelling All other systems reviewed and are negative except as documented above in ROS and HPI.  ____________________________________________   PHYSICAL EXAM:  VITAL SIGNS: ED Triage Vitals  Enc Vitals Group     BP 03/21/18 1836 128/81     Pulse Rate 03/21/18 1836 (!) 106     Resp 03/21/18 2031 13     Temp 03/21/18 1836 99.2 F (37.3 C)     Temp Source 03/21/18 1836 Oral     SpO2 03/21/18 1836 99 %     Weight 03/21/18 1837 210  lb (95.3 kg)     Height 03/21/18 1837 5\' 3"  (1.6 m)     Head Circumference --      Peak Flow --      Pain Score 03/21/18 1837 5     Pain Loc --      Pain Edu? --      Excl. in [place]? --     Vital signs reviewed, nursing assessments reviewed.   Constitutional:   Alert and oriented. Well appearing and in no distress. Eyes:   Conjunctivae are normal. EOMI. PERRL. ENT      Head:   Normocephalic and atraumatic.      Nose:   No congestion/rhinnorhea.       Mouth/Throat:   MMM, no pharyngeal erythema. No peritonsillar mass.       Neck:   No meningismus. Full ROM. Hematological/Lymphatic/Immunilogical:   No  cervical lymphadenopathy. Cardiovascular:   RRR. Symmetric bilateral radial and DP pulses.  No murmurs.  Respiratory:   Normal respiratory effort without tachypnea/retractions. Breath sounds are clear and equal bilaterally. No wheezes/rales/rhonchi. Gastrointestinal:   Soft with right upper quadrant tenderness. Non distended. There is no CVA tenderness.  No rebound, rigidity, or guarding. Negative murphy sign. Genitourinary:   deferred Musculoskeletal:   Normal range of motion in all extremities. No joint effusions.  No lower extremity tenderness.  No edema. Neurologic:   Normal speech and language.  Motor grossly intact. No acute focal neurologic deficits are appreciated.  Skin:    Skin is warm, dry and intact. No rash noted.  No petechiae, purpura, or bullae.  ____________________________________________    LABS (pertinent positives/negatives) (all labs ordered are listed, but only abnormal results are displayed) Labs Reviewed  COMPREHENSIVE METABOLIC PANEL - Abnormal; Notable for the following components:      Result Value   Glucose, Bld 118 (*)    All other components within normal limits  URINALYSIS, COMPLETE (UACMP) WITH MICROSCOPIC - Abnormal; Notable for the following components:   Color, Urine YELLOW (*)    APPearance CLEAR (*)    Squamous Epithelial / LPF 0-5 (*)    All other components within normal limits  LIPASE, BLOOD  CBC  POC URINE PREG, ED  POCT PREGNANCY, URINE   ____________________________________________   EKG    ____________________________________________    RADIOLOGY  US Abdomen Limited Ruq  Result Date: 03/21/2018 CLINICAL DATA:  Right upper quadrant pain EXAM: ULTRASOUND ABDOMEN LIMITED RIGHT UPPER QUADRANT COMPARISON:  None. FINDINGS: Gallbladder: No gallstones or wall thickening visualized. No sonographic Murphy sign noted by sonographer. Common bile duct: Diameter: Normal caliber, 5 mm Liver: No focal lesion identified. Within normal limits  in parenchymal echogenicity. Portal vein is patent on color Doppler imaging with normal direction of blood flow towards the liver. IMPRESSION: Unremarkable right upper quadrant ultrasound. Electronically Signed   By: Rolm Baptise M.D.   On: 03/21/2018 21:54    ____________________________________________   PROCEDURES Procedures  ____________________________________________  DIFFERENTIAL DIAGNOSIS   cholelithiasis, cholecystitis, viral gastroenteritis  CLINICAL IMPRESSION / ASSESSMENT AND PLAN / ED COURSE  Pertinent labs & imaging results that were available during my care of the patient were reviewed by me and considered in my medical decision making (see chart for details).    is well-appearing no acute distress, and unremarkable vital signs, presents with abdominal pain and diarrhea. Most likely a viral syndrome, with the pain and tenderness localizing to the right upper quadrant, I will obtain an ultrasound of the right upper quadrant to evaluate  for biliary disease. If negative and the patient can be discharged home with symptom control and primary care follow-up.  ----------------------------------------- 9:49 PM on 03/21/2018 -----------------------------------------  Ultrasound images viewed By me, appears to show a normal gallbladder without evidence of biliary disease. I'll follow-up on the radiology report. Labs are normal. Symptoms are controlled. Suitable for discharge home. Considering the patient's symptoms, medical history, and physical examination today, I have low suspicion for cholecystitis or biliary pathology, pancreatitis, perforation or bowel obstruction, hernia, intra-abdominal abscess, AAA or dissection, volvulus or intussusception, mesenteric ischemia, or appendicitis.   ----------------------------------------- 11:20 PM on 03/21/2018 -----------------------------------------  Radiology report for ultrasound confirms normal gallbladder. Vitals remain normal.  Patient is completing a by mouth trial. Plan for discharge.         ____________________________________________   FINAL CLINICAL IMPRESSION(S) / ED DIAGNOSES    Final diagnoses:  Right upper quadrant abdominal pain  Diarrhea of presumed infectious origin     ED Discharge Orders        Ordered    ondansetron (ZOFRAN ODT) 4 MG disintegrating tablet  Every 8 hours PRN     03/21/18 2318    famotidine (PEPCID) 20 MG tablet  2 times daily     03/21/18 2318    loperamide (IMODIUM A-D) 2 MG tablet  4 times daily PRN     03/21/18 2318      Portions of this note were generated with dragon dictation software. Dictation errors may occur despite best attempts at proofreading.    Carrie Mew, MD 03/21/18 2320

## 2018-03-21 NOTE — ED Triage Notes (Signed)
Pt to triage reports 20-25 liquid BMs since 0200 this am. Also c/o right flank and abd pain. Has nausea but no emesis. Denies pain or burning with  urination.

## 2018-03-21 NOTE — Discharge Instructions (Signed)
Your ultrasound of the gallbladder was normal. Your lab tests were also normal today. Take medications as prescribed to control your symptoms and follow up with your primary care doctor.

## 2018-03-21 NOTE — ED Notes (Signed)
RN to room to round on patient. Patient in ultrasound

## 2018-03-22 MED ORDER — PROMETHAZINE HCL 25 MG PO TABS: 25.0000 mg | ORAL_TABLET | Freq: Four times a day (QID) | ORAL | 0 refills | Status: DC | PRN

## 2018-03-22 NOTE — ED Notes (Signed)
Esig pad not working. Patient verbalized understanding of all information discussed.

## 2018-03-22 NOTE — ED Notes (Signed)
Reviewed discharge instructions, follow-up care, and prescriptions with patient. Patient verbalized understanding of all information reviewed. Patient stable, with no distress noted at this time.    

## 2018-03-22 NOTE — ED Notes (Signed)
MD Beather Arbour provided additional prescription for phenergan. This RN reviewed prescription with patient. Patient verbalized understanding.

## 2018-05-30 DIAGNOSIS — J029 Acute pharyngitis, unspecified: Secondary | ICD-10-CM | POA: Diagnosis not present

## 2018-05-31 ENCOUNTER — Other Ambulatory Visit: Payer: Self-pay

## 2018-05-31 MED ORDER — LEVONORGESTREL-ETHINYL ESTRAD 0.15-30 MG-MCG PO TABS: 1.0000 | ORAL_TABLET | Freq: Every day | ORAL | 0 refills | Status: DC

## 2018-05-31 NOTE — Telephone Encounter (Signed)
Pt calling for refill of bcp, annual sched 7/19.  [phone]   Refill eRx'd.  Mailbox full utr pt.

## 2018-06-17 NOTE — Progress Notes (Signed)
Gynecology Annual Exam  PCP: Trinna Post, PA-C  Chief Complaint:  Chief Complaint  Patient presents with  . Gynecologic Exam    History of Present Illness:Emily Zhang is a 32 year old Caucasian/White female, G2 P2002, who presents for her annual exam. She is having no significant GYN problems. Her menses are regular and her LMP was 05/21/2018. They occur every 28 days, they last 5-6 days, are moderate flow with 1 heavier day with clots requiring pad change 3-4x/day. She has had no spotting.  She reports dysmenorrhea occasionally. She occasionally uses OTC analgesics when needed with relief. Reports premenstrual irritability.  The patient's past medical history is notable for a history of obesity.  Since her 6 week postpartum visit on 01/15/2017, she has been having some problems with headaches. She has two different kinds of headaches. One is associated with sinus congestion and is in the ethmoid area, and the other is behind her left eye (pressure) and in the occipital area. This second type of headache is present a great deal of the time and occasionally goes away with sleep. Analgesics are +/- effective. Has not had an eye exam in a long time. She also has had problems with left TMJ pain and has a cervical spur. She recently had a wisdom tooth excised. Prior to her last pregnancy, she was undergoing an evaluation of epigastric discomfort and alternating diarrhea and constipation. She has not been back to see the gastroenterologist yet. She also has had bilateral knee pain, left>right and had a cortisone injection in the left knee recently at [facility]/.  She is sexually active. She is currently using birth control pills for contraception. Her most recent pap smear was obtained 01/26/2015 and was NIL/neg HRHPV Mammogram is not applicable.  There is no family history of breast cancer.  There is no family history of ovarian cancer.  The patient does do monthly self breast  exams.  The patient does not smoke.  The patient does drink alcohol: 1-2 glasses a week.  The patient does not use illegal drugs.  The patient does not exercise.  The patient does get adequate calcium in her diet.  She had a recent cholesterol screen in 2015 that was abnormal and (T chol: 261). Desires testing today.   Review of Systems: Review of Systems  Constitutional: Negative for chills, fever and weight loss.  HENT: Positive for congestion, sore throat (treated for strep throat recently) and tinnitus. Negative for sinus pain.   Eyes: Negative for blurred vision and pain.  Respiratory: Negative for hemoptysis, shortness of breath and wheezing.   Cardiovascular: Negative for chest pain, palpitations and leg swelling.  Gastrointestinal: Positive for constipation and diarrhea. Negative for abdominal pain, blood in stool, heartburn, nausea and vomiting.  Genitourinary: Negative for dysuria, frequency, hematuria and urgency.  Musculoskeletal: Positive for joint pain (and swellingi in the knees). Negative for back pain and myalgias.  Skin: Negative for itching and rash.  Neurological: Positive for headaches. Negative for dizziness and tingling.  Endo/Heme/Allergies: Positive for environmental allergies (sneezing and coughing). Negative for polydipsia. Does not bruise/bleed easily.       Negative for hirsutism   Psychiatric/Behavioral: Negative for depression. The patient is not nervous/anxious and does not have insomnia.     Past Medical History:  Past Medical History:  Diagnosis Date  . Allergic rhinitis   . Bone spur 05/2017   cervical  . Obesity   . Ovarian cyst   . Viral pneumonia  12/2014    Past Surgical History:  Past Surgical History:  Procedure Laterality Date  . DENTAL SURGERY  2013; 12/2016   WISDOM TEETH EXTRACTION    Family History:  Family History  Problem Relation Age of Onset  . Colon cancer Maternal Uncle 38  . Lung cancer Maternal Uncle   . Melanoma  Maternal Grandmother 76  . Lung cancer Maternal Grandfather   . Heart disease Maternal Grandfather        CARDIAC ARREST  . Colon cancer Maternal Grandfather 86  . Diabetes Paternal Grandfather   . Endometriosis Cousin        MATERNAL 1ST COUSIN    Social History:  Social History   Socioeconomic History  . Marital status: Married    Spouse name: Emily Zhang  . Number of children: 2  . Years of education: 80  . Highest education level: Not on file  Occupational History  . Occupation: RETAIL SALES    Comment: [place]  Social Needs  . Financial resource strain: Not on file  . Food insecurity:    Worry: Not on file    Inability: Not on file  . Transportation needs:    Medical: Not on file    Non-medical: Not on file  Tobacco Use  . Smoking status: Former Smoker    Last attempt to quit: 05/2014    Years since quitting: 4.1  . Smokeless tobacco: Never Used  Substance and Sexual Activity  . Alcohol use: Yes    Comment: 2-3/WK  . Drug use: No  . Sexual activity: Yes    Partners: Male    Birth control/protection: Pill  Lifestyle  . Physical activity:    Days per week: Not on file    Minutes per session: Not on file  . Stress: Not on file  Relationships  . Social connections:    Talks on phone: Not on file    Gets together: Not on file    Attends religious service: Not on file    Active member of club or organization: Not on file    Attends meetings of clubs or organizations: Not on file    Relationship status: Not on file  . Intimate partner violence:    Fear of current or ex partner: Not on file    Emotionally abused: Not on file    Physically abused: Not on file    Forced sexual activity: Not on file  Other Topics Concern  . Not on file  Social History Narrative  . Not on file    Allergies:  Allergies  Allergen Reactions  . Hydrocodone-Acetaminophen Rash  . Vicodin [Hydrocodone-Acetaminophen] Rash    Medications: Kurvelo OCPs and  Current  Outpatient Medications on File Prior to Visit  Medication Sig Dispense Refill  . diphenhydrAMINE (BENADRYL) 25 mg capsule Take 25 mg by mouth every 6 (six) hours as needed.    . Multiple Vitamin (MULTIVITAMIN) tablet Take 1 tablet by mouth daily.     No current facility-administered medications on file prior to visit.    Physical Exam Vitals: BP 110/80   Pulse 60   Ht 5\' 3"  (1.6 m)   Wt 216 lb (98 kg)   LMP 05/18/2018   BMI 38.26 kg/m   General: WF in  NAD HEENT: normocephalic, anicteric Neck: no thyroid enlargement, no palpable nodules, no cervical lymphadenopathy  Pulmonary: No increased work of breathing, CTAB Cardiovascular: RRR, without murmur  Breast: Breast symmetrical, no tenderness, no palpable nodules or masses,  no skin or nipple retraction present, no nipple discharge.  No axillary, infraclavicular or supraclavicular lymphadenopathy. Abdomen: Soft, non-tender, non-distended.  Umbilicus without lesions.  No hepatomegaly or masses palpable. No evidence of hernia. Genitourinary:  External: Normal external female genitalia.  Normal urethral meatus, normal  Bartholin's and Skene's glands.    Vagina: Normal vaginal mucosa, no evidence of prolapse.    Cervix: Grossly normal in appearance, no bleeding, non-tender  Uterus: Retroflexed, normal size, shape, and consistency, mobile, and non-tender  Adnexa: No adnexal masses, non-tender  Rectal: deferred  Lymphatic: no evidence of inguinal lymphadenopathy Extremities: no edema, erythema, or tenderness Neurologic: Grossly intact Psychiatric: mood appropriate, affect full     Assessment: 32 y.o. [id number] annual gyn exam  Plan:   1) Breast cancer screening - recommend monthly self breast exam.   2) Cervical cancer screening - Pap was done. ASCCP guidelines and rational discussed.  Patient opts for every 3 years screening interval  3) Contraception - Refill of Kurvelo #1 pack/RF x 11 sent to pharmacy  4) Routine healthcare  maintenance including cholesterol screening ordered today   5) Recommend eye exam as part of an evaluation of headaches  6) RTO in 1 year. Will notify of results of labs  Dalia Heading, [place]

## 2018-06-18 ENCOUNTER — Ambulatory Visit (INDEPENDENT_AMBULATORY_CARE_PROVIDER_SITE_OTHER): Payer: 59 | Admitting: Certified Nurse Midwife

## 2018-06-18 ENCOUNTER — Other Ambulatory Visit (HOSPITAL_COMMUNITY)
Admission: RE | Admit: 2018-06-18 | Discharge: 2018-06-18 | Disposition: A | Discharge status: Home / Self Care | Payer: 59 | Source: Ambulatory Visit | Attending: Certified Nurse Midwife | Admitting: Certified Nurse Midwife

## 2018-06-18 ENCOUNTER — Encounter: Payer: Self-pay | Admitting: Certified Nurse Midwife

## 2018-06-18 VITALS — BP 110/80 | HR 60 | Ht 63.0 in | Wt 216.0 lb

## 2018-06-18 DIAGNOSIS — R51 Headache: Secondary | ICD-10-CM

## 2018-06-18 DIAGNOSIS — Z13220 Encounter for screening for lipoid disorders: Secondary | ICD-10-CM | POA: Diagnosis not present

## 2018-06-18 DIAGNOSIS — Z01411 Encounter for gynecological examination (general) (routine) with abnormal findings: Secondary | ICD-10-CM

## 2018-06-18 DIAGNOSIS — E6609 Other obesity due to excess calories: Secondary | ICD-10-CM | POA: Diagnosis not present

## 2018-06-18 DIAGNOSIS — Z6838 Body mass index (BMI) 38.0-38.9, adult: Secondary | ICD-10-CM

## 2018-06-18 DIAGNOSIS — Z01419 Encounter for gynecological examination (general) (routine) without abnormal findings: Secondary | ICD-10-CM

## 2018-06-18 DIAGNOSIS — Z124 Encounter for screening for malignant neoplasm of cervix: Secondary | ICD-10-CM

## 2018-06-18 MED ORDER — LEVONORGESTREL-ETHINYL ESTRAD 0.15-30 MG-MCG PO TABS: 1.0000 | ORAL_TABLET | Freq: Every day | ORAL | 11 refills | Status: DC

## 2018-06-19 ENCOUNTER — Encounter: Payer: Self-pay | Admitting: Certified Nurse Midwife

## 2018-06-19 DIAGNOSIS — E669 Obesity, unspecified: Secondary | ICD-10-CM | POA: Insufficient documentation

## 2018-06-19 LAB — LIPID PANEL
CHOLESTEROL TOTAL: 225 mg/dL — AB (ref 100–199)
Chol/HDL Ratio: 6.3 ratio — ABNORMAL HIGH (ref 0.0–4.4)
HDL: 36 mg/dL — ABNORMAL LOW (ref >39)
LDL Calculated: 161 mg/dL — ABNORMAL HIGH (ref 0–99)
Triglycerides: 140 mg/dL (ref 0–149)
VLDL Cholesterol Cal: 28 mg/dL (ref 5–40)

## 2018-06-22 LAB — CYTOLOGY - PAP
Diagnosis: NEGATIVE
HPV (WINDOPATH): NOT DETECTED

## 2018-06-24 ENCOUNTER — Encounter: Payer: Self-pay | Admitting: Certified Nurse Midwife

## 2018-06-24 NOTE — Progress Notes (Signed)
Patient called concerning her lipid panel and Pap smear results. Will send results in the mail as well as a pamphlet on decreasing cholesterol. Dalia Heading, CNM

## 2018-08-28 DIAGNOSIS — J00 Acute nasopharyngitis [common cold]: Secondary | ICD-10-CM | POA: Diagnosis not present

## 2018-10-05 DIAGNOSIS — S86811A Strain of other muscle(s) and tendon(s) at lower leg level, right leg, initial encounter: Secondary | ICD-10-CM | POA: Diagnosis not present

## 2018-10-05 DIAGNOSIS — M1711 Unilateral primary osteoarthritis, right knee: Secondary | ICD-10-CM | POA: Diagnosis not present

## 2019-06-30 NOTE — Progress Notes (Signed)
Gynecology Annual Exam  PCP: Trinna Post, PA-C  Chief Complaint:  Chief Complaint  Patient presents with  . Gynecologic Exam    History of Present Illness:Emily Zhang is a 33 year old Caucasian/White female, G2 P2002, who presents for her annual exam. She is having no significant GYN problems. Her menses are regular and her LMP was 06/24/2019. They occur every month, they last 5 days, are moderate flow with 1 heavier day with clots requiring tampon change every 2 hours She has had no spotting.  She reports dysmenorrhea with heavier bleeding. She occasionally uses OTC analgesics when needed with relief. Reports premenstrual irritability.  The patient's past medical history is notable for a history of obesity.  Since her last annual exam 06/18/18,  she continues to have knee pain due to arthritis. She has had some relief with cortisone injections and with CBD oil. Her husband had a stroke last year and is slowly recovering from it. She stopped taking her OCPs since they have not been having vaginal intercourse. Her most recent pap smear was obtained 06/18/2018 and was NIL/neg HRHPV Mammogram is not applicable.  There is no family history of breast cancer.  There is no family history of ovarian cancer.  The patient does do monthly self breast exams.  The patient does not smoke.  The patient does drink alcohol: 2-3glasses of wine a week.  The patient does not use illegal drugs.  The patient does not exercise.  The patient does get adequate calcium in her diet.  She had a recent cholesterol screen in 2019 that was abnormal and (T chol: 225, HDL 36, LDL 167).   Review of Systems: Review of Systems  Constitutional: Negative for chills, fever and weight loss.  HENT: Negative for congestion, sinus pain, sore throat and tinnitus.   Eyes: Negative for blurred vision and pain.  Respiratory: Negative for hemoptysis, shortness of breath and wheezing.   Cardiovascular: Negative  for chest pain, palpitations and leg swelling.  Gastrointestinal: Negative for abdominal pain, blood in stool, constipation, diarrhea, heartburn, nausea and vomiting.  Genitourinary: Negative for dysuria, frequency, hematuria and urgency.  Musculoskeletal: Positive for joint pain (and swellingi in the knees). Negative for back pain and myalgias.  Skin: Negative for itching and rash.  Neurological: Negative for dizziness, tingling and headaches.  Endo/Heme/Allergies: Negative for environmental allergies and polydipsia. Does not bruise/bleed easily.       Negative for hirsutism   Psychiatric/Behavioral: Negative for depression. The patient is not nervous/anxious and does not have insomnia.     Past Medical History:  Past Medical History:  Diagnosis Date  . Allergic rhinitis   . Bone spur 05/2017   cervical  . Obesity   . Ovarian cyst   . Viral pneumonia 12/2014    Past Surgical History:  Past Surgical History:  Procedure Laterality Date  . DENTAL SURGERY  2013; 12/2016   WISDOM TEETH EXTRACTION    Family History:  Family History  Problem Relation Age of Onset  . Colon cancer Maternal Uncle 40  . Lung cancer Maternal Uncle   . Melanoma Maternal Grandmother 42  . Lung cancer Maternal Grandfather   . Heart disease Maternal Grandfather        CARDIAC ARREST  . Colon cancer Maternal Grandfather 29  . Diabetes Paternal Grandfather   . Endometriosis Cousin        MATERNAL 1ST COUSIN    Social History:  Social History   Socioeconomic History  .  Marital status: Married    Spouse name: [name]  . Number of children: 2  . Years of education: 69  . Highest education level: Not on file  Occupational History  . Occupation: RETAIL SALES    Comment: [place]  Social Needs  . Financial resource strain: Not on file  . Food insecurity    Worry: Not on file    Inability: Not on file  . Transportation needs    Medical: Not on file    Non-medical: Not on file   Tobacco Use  . Smoking status: Former Smoker    Quit date: 05/2014    Years since quitting: 5.1  . Smokeless tobacco: Never Used  Substance and Sexual Activity  . Alcohol use: Yes    Comment: 2-3/WK  . Drug use: No  . Sexual activity: Yes    Partners: Male    Birth control/protection: Pill  Lifestyle  . Physical activity    Days per week: Not on file    Minutes per session: Not on file  . Stress: Not on file  Relationships  . Social Herbalist on phone: Not on file    Gets together: Not on file    Attends religious service: Not on file    Active member of club or organization: Not on file    Attends meetings of clubs or organizations: Not on file    Relationship status: Not on file  . Intimate partner violence    Fear of current or ex partner: Not on file    Emotionally abused: Not on file    Physically abused: Not on file    Forced sexual activity: Not on file  Other Topics Concern  . Not on file  Social History Narrative  . Not on file    Allergies:  Allergies  Allergen Reactions  . Hydrocodone-Acetaminophen Rash  . Vicodin [Hydrocodone-Acetaminophen] Rash    Medications:  Current Outpatient Medications on File Prior to Visit  Medication Sig Dispense Refill  . Multiple Vitamin (MULTIVITAMIN) tablet Take 1 tablet by mouth daily.           No current facility-administered medications on file prior to visit.    Physical Exam Vitals: BP 128/80   Ht 5' 3.5" (1.613 m)   Wt 230 lb 12.8 oz (104.7 kg)   LMP 06/24/2019 (Approximate)   BMI 40.24 kg/m   General: WF in  NAD HEENT: normocephalic, anicteric Neck: no thyroid enlargement, no palpable nodules, no cervical lymphadenopathy  Pulmonary: No increased work of breathing, CTAB Cardiovascular: RRR, without murmur  Breast: Breast symmetrical, no tenderness, no palpable nodules or masses, no skin or nipple retraction present, no nipple discharge.  No axillary, infraclavicular or supraclavicular  lymphadenopathy. Abdomen: Soft, non-tender, non-distended.  Umbilicus without lesions.  No hepatomegaly or masses palpable. No evidence of hernia. Genitourinary:  External: Normal external female genitalia.  Normal urethral meatus, normal  Bartholin's and Skene's glands.    Vagina: Normal vaginal mucosa, no evidence of prolapse.    Cervix: Grossly normal in appearance, no bleeding, non-tender  Uterus: Retroflexed, normal size, shape, and consistency, mobile, and non-tender  Adnexa: No adnexal masses, non-tender  Rectal: deferred  Lymphatic: no evidence of inguinal lymphadenopathy Extremities: no edema, erythema, or tenderness Neurologic: Grossly intact Psychiatric: mood appropriate, affect full     Assessment: 33 y.o. [id number] annual gyn exam  Plan:   1) Breast cancer screening - recommend monthly self breast exam.   2) Cervical cancer  screening - Pap was not done. ASCCP guidelines and rational discussed.  Patient opts for every 3 years screening interval  3) Contraception - not needed at this time  4) RTO in 1 year.   Dalia Heading, CNM

## 2019-07-01 ENCOUNTER — Ambulatory Visit (INDEPENDENT_AMBULATORY_CARE_PROVIDER_SITE_OTHER): Payer: 59 | Admitting: Certified Nurse Midwife

## 2019-07-01 ENCOUNTER — Other Ambulatory Visit: Payer: Self-pay

## 2019-07-01 ENCOUNTER — Encounter: Payer: Self-pay | Admitting: Certified Nurse Midwife

## 2019-07-01 VITALS — BP 128/80 | Ht 63.5 in | Wt 230.8 lb

## 2019-07-01 DIAGNOSIS — Z01419 Encounter for gynecological examination (general) (routine) without abnormal findings: Secondary | ICD-10-CM | POA: Diagnosis not present

## 2019-11-04 ENCOUNTER — Other Ambulatory Visit: Payer: Self-pay

## 2019-11-04 DIAGNOSIS — Z20822 Contact with and (suspected) exposure to covid-19: Secondary | ICD-10-CM

## 2019-11-07 LAB — NOVEL CORONAVIRUS, NAA: SARS-CoV-2, NAA: NOT DETECTED

## 2020-01-08 IMAGING — US US ABDOMEN LIMITED
1 series · 14 of 25 positions shown · non-contrast
Comparison: None.

CLINICAL DATA: Right upper quadrant pain

EXAM:
ULTRASOUND ABDOMEN LIMITED RIGHT UPPER QUADRANT

[Series 1: us abdomen limited · 14 of 55 slices shown]
[im 1/55]
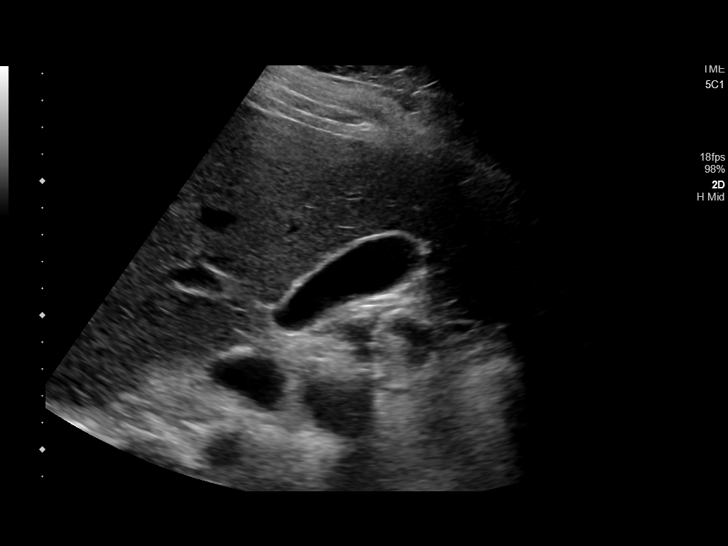
[im 5/55]
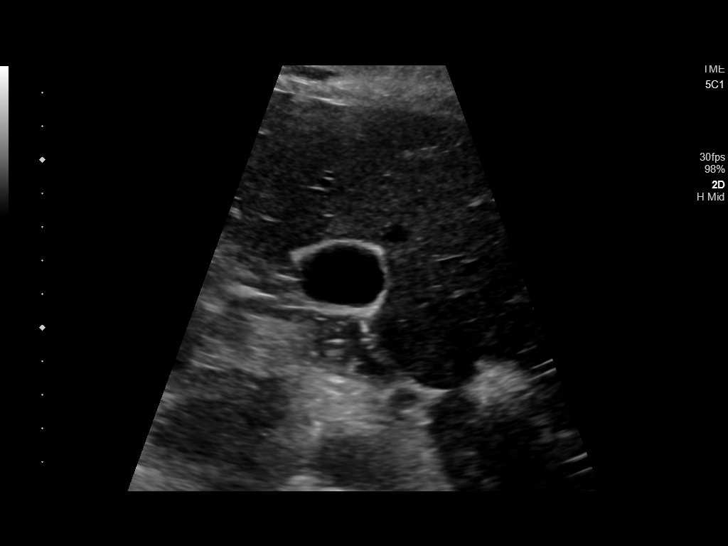
[im 10/55]
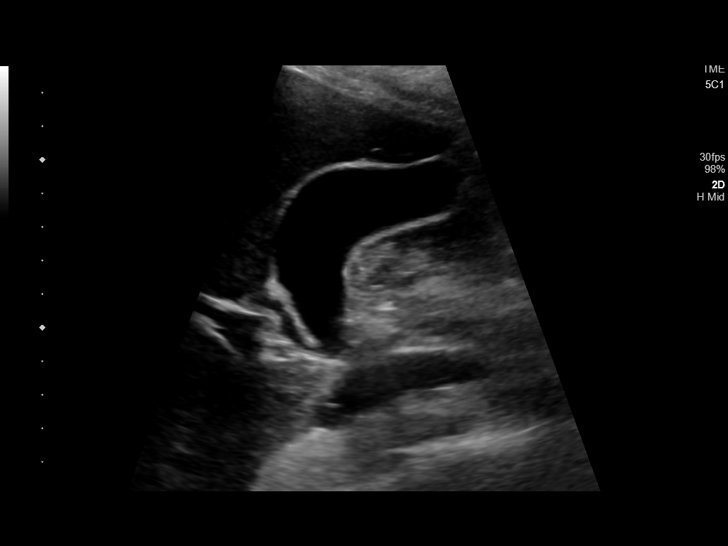
[im 14/55]
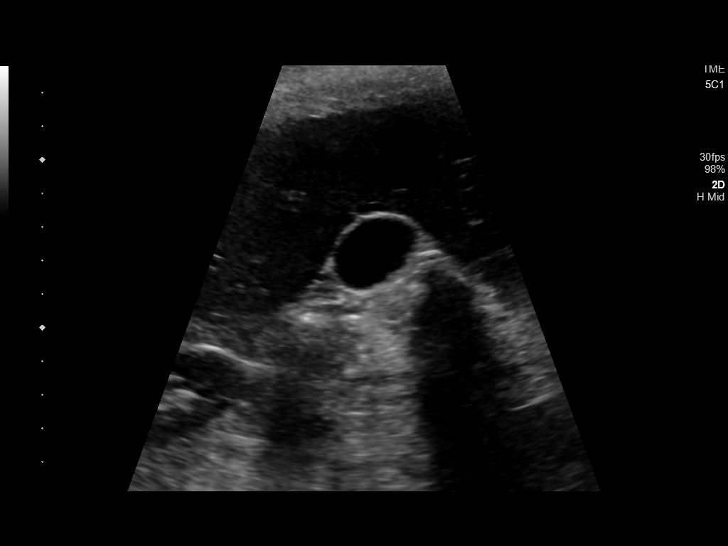
[im 19/55]
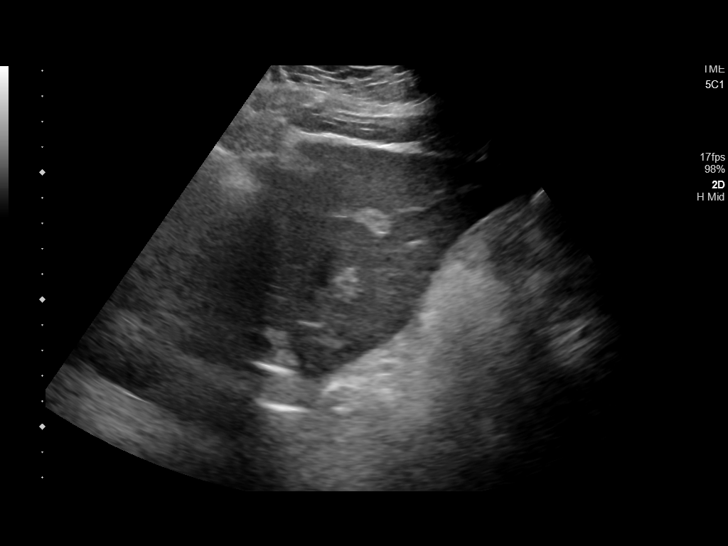
[im 21/55]
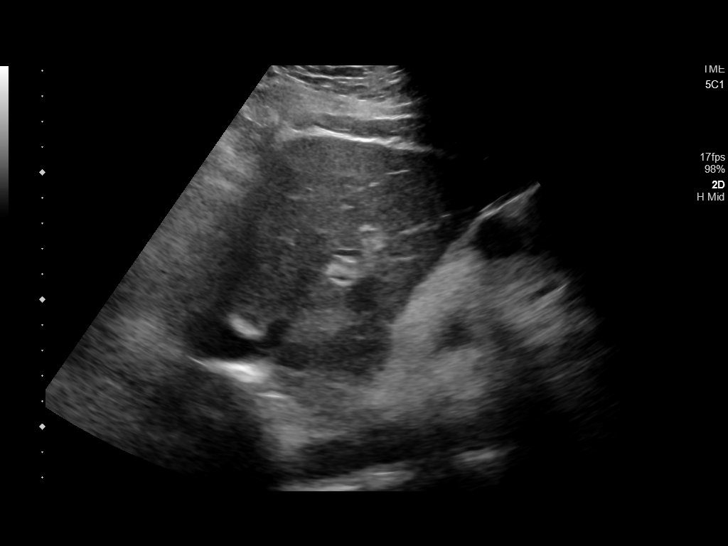
[im 25/55]
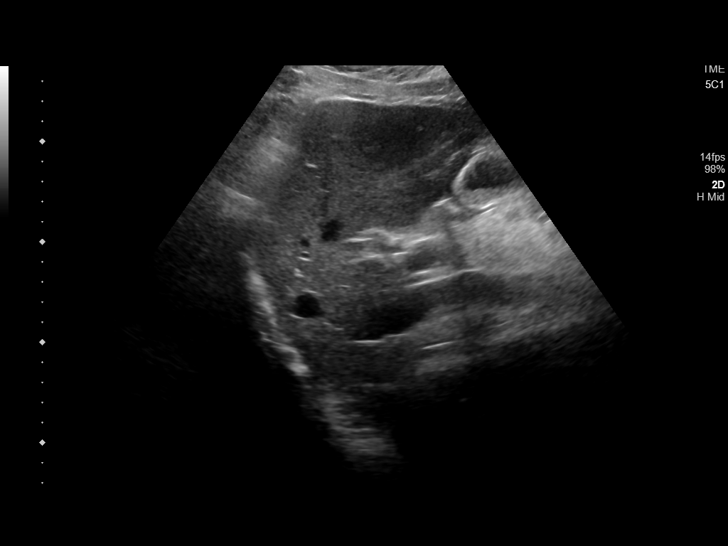
[im 30/55]
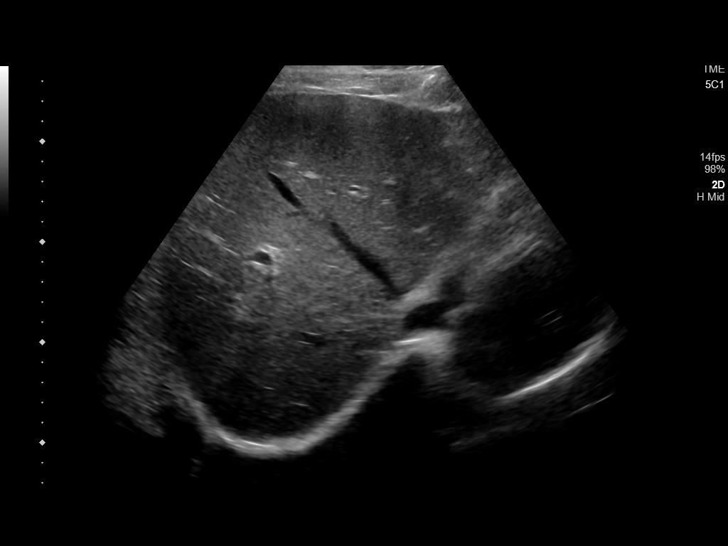
[im 34/55]
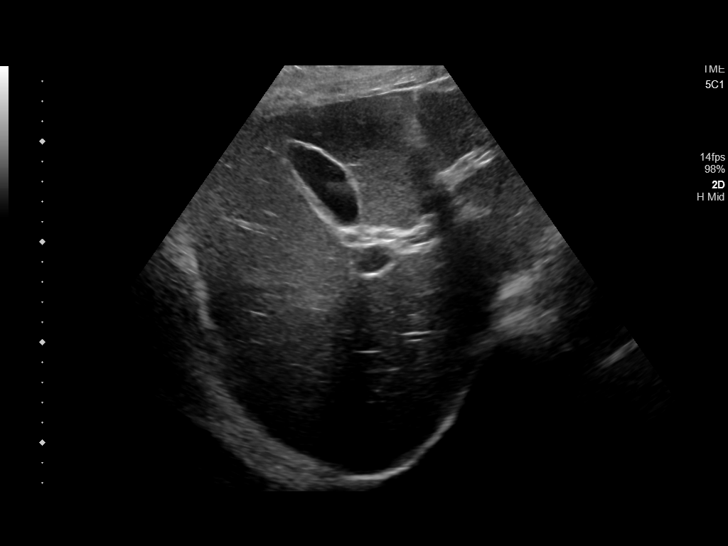
[im 37/55]
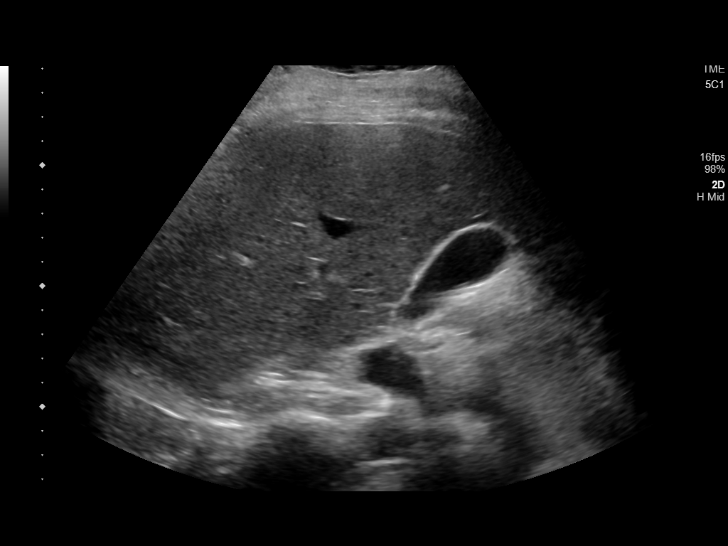
[im 41/55]
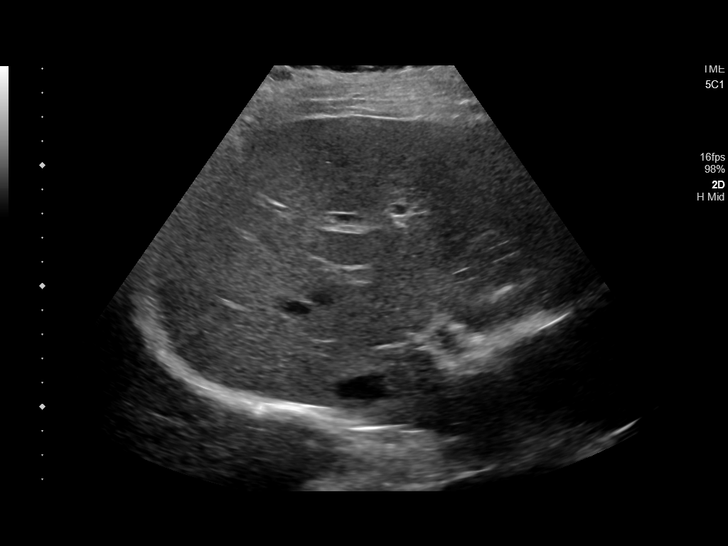
[im 46/55]
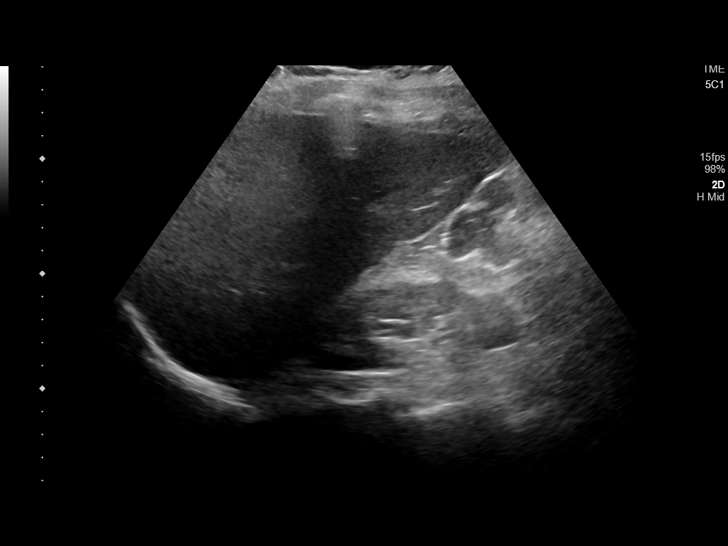
[im 50/55]
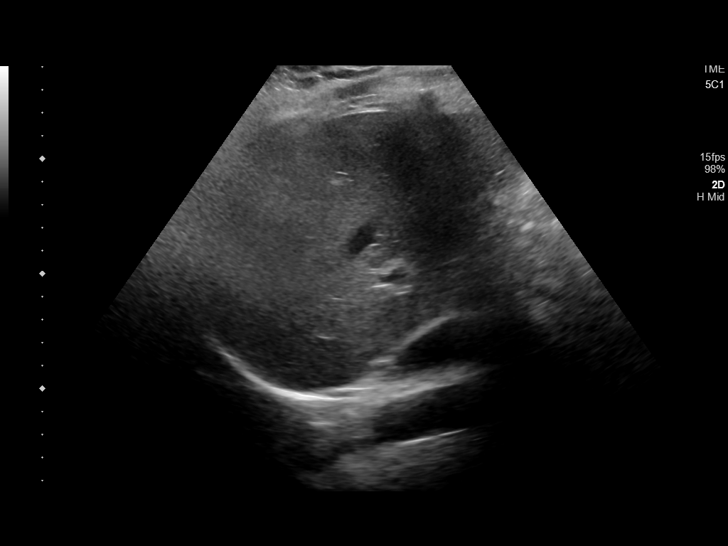
[im 55/55]
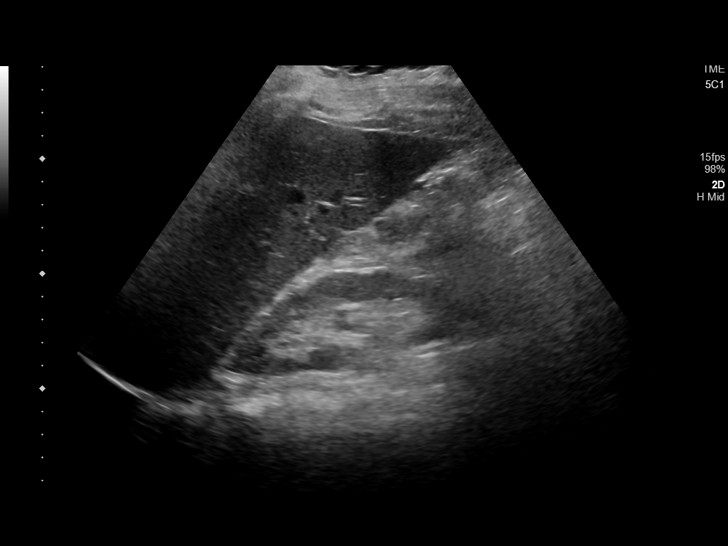

[14 of 25 positions shown; findings below may reference images not displayed]

FINDINGS: Gallbladder:

No gallstones or wall thickening visualized. No sonographic Murphy
sign noted by sonographer.

Common bile duct:

Diameter: Normal caliber, 5 mm

Liver:

No focal lesion identified. Within normal limits in parenchymal
echogenicity. Portal vein is patent on color Doppler imaging with
normal direction of blood flow towards the liver.
IMPRESSION: Unremarkable right upper quadrant ultrasound.

## 2020-12-27 DIAGNOSIS — M25511 Pain in right shoulder: Secondary | ICD-10-CM | POA: Diagnosis not present

## 2021-01-09 DIAGNOSIS — M25561 Pain in right knee: Secondary | ICD-10-CM | POA: Diagnosis not present

## 2021-01-24 DIAGNOSIS — M25561 Pain in right knee: Secondary | ICD-10-CM | POA: Diagnosis not present

## 2021-01-28 DIAGNOSIS — M25511 Pain in right shoulder: Secondary | ICD-10-CM | POA: Diagnosis not present

## 2021-02-04 ENCOUNTER — Other Ambulatory Visit (HOSPITAL_COMMUNITY): Payer: Self-pay | Admitting: Physician Assistant

## 2021-02-04 DIAGNOSIS — M542 Cervicalgia: Secondary | ICD-10-CM | POA: Diagnosis not present

## 2021-02-04 DIAGNOSIS — M25511 Pain in right shoulder: Secondary | ICD-10-CM | POA: Diagnosis not present

## 2021-02-11 DIAGNOSIS — M25511 Pain in right shoulder: Secondary | ICD-10-CM | POA: Diagnosis not present

## 2021-02-13 DIAGNOSIS — M25511 Pain in right shoulder: Secondary | ICD-10-CM | POA: Diagnosis not present

## 2021-02-26 ENCOUNTER — Other Ambulatory Visit (HOSPITAL_COMMUNITY): Payer: Self-pay | Admitting: Family Medicine

## 2021-02-26 ENCOUNTER — Ambulatory Visit
Admission: EM | Admit: 2021-02-26 | Discharge: 2021-02-26 | Disposition: A | Discharge status: Home / Self Care | Payer: 59

## 2021-02-26 ENCOUNTER — Other Ambulatory Visit: Payer: Self-pay

## 2021-02-26 DIAGNOSIS — J0110 Acute frontal sinusitis, unspecified: Secondary | ICD-10-CM

## 2021-02-26 DIAGNOSIS — R059 Cough, unspecified: Secondary | ICD-10-CM

## 2021-02-26 MED ORDER — AMOXICILLIN-POT CLAVULANATE 875-125 MG PO TABS: 1.0000 | ORAL_TABLET | Freq: Two times a day (BID) | ORAL | 0 refills | Status: DC

## 2021-02-26 NOTE — ED Triage Notes (Signed)
Pt presents with cough and chest congestion x 2 weeks.  Productive for greenish gray mucous.  Felt that she couldn't take a deep breath last night.  Hoarseness x 1 week and worse with increased coughing.  "Not a strep throat sore throat".

## 2021-02-26 NOTE — Discharge Instructions (Addendum)
Antibiotics as prescribed Continue the OTC medicines as needed.  Follow up as needed for continued or worsening symptoms

## 2021-02-27 DIAGNOSIS — M25511 Pain in right shoulder: Secondary | ICD-10-CM | POA: Diagnosis not present

## 2021-02-27 NOTE — ED Provider Notes (Signed)
Emily Zhang    CSN: [id number] Arrival date & time: 02/26/21  1359      History   Chief Complaint Chief Complaint  Patient presents with  . Cough    HPI Emily Zhang is a 35 y.o. female.   Patient is a 35 year old female presents today with nasal congestion, cough and chest congestion x2 weeks.  Reporting productive cough with green/gray mucus.  Mild shortness of breath.  Some laryngitis for a week.  No specific sore throat, ear pain.  Low-grade fever here today.   Cough   Past Medical History:  Diagnosis Date  . Allergic rhinitis   . Arthritis 2019   Knee  . Bone spur 05/2017   cervical  . Obesity   . Ovarian cyst   . Viral pneumonia 12/2014    Patient Active Problem List   Diagnosis Date Noted  . Obesity   . Depressed mood 06/05/2017  . Sebaceous cyst 06/05/2017    Past Surgical History:  Procedure Laterality Date  . DENTAL SURGERY  2013; 12/2016   WISDOM TEETH EXTRACTION    OB History    Gravida  2   Para  2   Term  1   Preterm  1   AB      Living  2     SAB      IAB      Ectopic      Multiple  0   Live Births  2        Obstetric Comments  IOL for advanced dilation         Home Medications    Prior to Admission medications   Medication Sig Start Date End Date Taking? Authorizing Provider  amoxicillin-clavulanate (AUGMENTIN) 875-125 MG tablet Take 1 tablet by mouth every 12 (twelve) hours. 02/26/21  Yes Harvis Mabus A, NP  cyclobenzaprine (FLEXERIL) 5 MG tablet Take 5 mg by mouth 3 (three) times daily as needed for muscle spasms.   Yes [provider]  meloxicam (MOBIC) 7.5 MG tablet Take 7.5 mg by mouth daily.   Yes [provider]  Multiple Vitamin (MULTIVITAMIN) tablet Take 1 tablet by mouth daily.   Yes [provider]    Family History Family History  Problem Relation Age of Onset  . Colon cancer Maternal Uncle 4  . Lung cancer Maternal Uncle   . Melanoma Maternal  Grandmother 80  . Lung cancer Maternal Grandfather   . Heart disease Maternal Grandfather        CARDIAC ARREST  . Colon cancer Maternal Grandfather 51  . Diabetes Paternal Grandfather   . Endometriosis Cousin        MATERNAL 1ST COUSIN    Social History Social History   Tobacco Use  . Smoking status: Former Smoker    Quit date: 05/2014    Years since quitting: 6.8  . Smokeless tobacco: Never Used  Vaping Use  . Vaping Use: Never used  Substance Use Topics  . Alcohol use: Yes    Comment: 2-3/WK  . Drug use: No     Allergies   Hydrocodone-acetaminophen and Vicodin [hydrocodone-acetaminophen]   Review of Systems Review of Systems  Respiratory: Positive for cough.      Physical Exam Triage Vital Signs ED Triage Vitals  Enc Vitals Group     BP 02/26/21 1420 139/83     Pulse Rate 02/26/21 1420 84     Resp 02/26/21 1420 18     Temp 02/26/21 1420  99 F (37.2 C)     Temp Source 02/26/21 1420 Oral     SpO2 02/26/21 1420 98 %     Weight --      Height --      Head Circumference --      Peak Flow --      Pain Score 02/26/21 1416 0     Pain Loc --      Pain Edu? --      Excl. in [place]? --    No data found.  Updated Vital Signs BP 139/83 (BP Location: Left Arm)   Pulse 84   Temp 99 F (37.2 C) (Oral)   Resp 18   LMP 01/29/2021   SpO2 98%   Visual Acuity Right Eye Distance:   Left Eye Distance:   Bilateral Distance:    Right Eye Near:   Left Eye Near:    Bilateral Near:     Physical Exam Vitals and nursing note reviewed.  Constitutional:      General: She is not in acute distress.    Appearance: Normal appearance. She is not ill-appearing, toxic-appearing or diaphoretic.  HENT:     Head: Normocephalic.     Right Ear: Tympanic membrane and ear canal normal.     Left Ear: Tympanic membrane and ear canal normal.     Nose: Congestion present.     Mouth/Throat:     Pharynx: Oropharynx is clear.  Eyes:     Conjunctiva/sclera: Conjunctivae normal.   Cardiovascular:     Rate and Rhythm: Normal rate and regular rhythm.  Pulmonary:     Effort: Pulmonary effort is normal.     Breath sounds: Normal breath sounds.  Musculoskeletal:        General: Normal range of motion.     Cervical back: Normal range of motion.  Skin:    General: Skin is warm and dry.     Findings: No rash.  Neurological:     Mental Status: She is alert.  Psychiatric:        Mood and Affect: Mood normal.      UC Treatments / Results  Labs (all labs ordered are listed, but only abnormal results are displayed) Labs Reviewed - No data to display  EKG   Radiology No results found.  Procedures Procedures (including critical care time)  Medications Ordered in UC Medications - No data to display  Initial Impression / Assessment and Plan / UC Course  I have reviewed the triage vital signs and the nursing notes.  Pertinent labs & imaging results that were available during my care of the patient were reviewed by me and considered in my medical decision making (see chart for details).     Sinusitis with cough This has been and  ongoing issue for at least 2 weeks and worse.  We will go ahead and treat for bacterial infection at this time.  Treating with amoxicillin. Continue over-the-counter medicines as needed Follow up as needed for continued or worsening symptoms  Final Clinical Impressions(s) / UC Diagnoses   Final diagnoses:  Acute non-recurrent frontal sinusitis  Cough     Discharge Instructions     Antibiotics as prescribed Continue the OTC medicines as needed.  Follow up as needed for continued or worsening symptoms     ED Prescriptions    Medication Sig Dispense Auth. Provider   amoxicillin-clavulanate (AUGMENTIN) 875-125 MG tablet Take 1 tablet by mouth every 12 (twelve) hours. 14 tablet Orvan July, NP  PDMP not reviewed this encounter.   Orvan July, NP 02/27/21 [phone]

## 2021-03-07 DIAGNOSIS — M25511 Pain in right shoulder: Secondary | ICD-10-CM | POA: Diagnosis not present

## 2021-03-23 ENCOUNTER — Emergency Department: Payer: 59

## 2021-03-23 ENCOUNTER — Other Ambulatory Visit: Payer: Self-pay

## 2021-03-23 ENCOUNTER — Emergency Department
Admission: EM | Admit: 2021-03-23 | Discharge: 2021-03-23 | Disposition: A | Discharge status: Home / Self Care | Payer: 59 | Attending: Emergency Medicine | Admitting: Emergency Medicine

## 2021-03-23 DIAGNOSIS — R519 Headache, unspecified: Secondary | ICD-10-CM | POA: Insufficient documentation

## 2021-03-23 DIAGNOSIS — Z87891 Personal history of nicotine dependence: Secondary | ICD-10-CM | POA: Insufficient documentation

## 2021-03-23 DIAGNOSIS — R110 Nausea: Secondary | ICD-10-CM | POA: Insufficient documentation

## 2021-03-23 MED ORDER — BUTALBITAL-APAP-CAFFEINE 50-325-40 MG PO TABS: 1.0000 | ORAL_TABLET | Freq: Four times a day (QID) | ORAL | 0 refills | Status: DC | PRN

## 2021-03-23 NOTE — Discharge Instructions (Signed)
Follow-up with your primary care provider if any continued problems.  Return to the emergency department if any severe worsening of your headache, dizziness, vision or persistent nausea and vomiting.  Do not drive or operate machinery while taking the pain medication for headache as it could cause increased injury due to drowsiness.  Increase fluids.

## 2021-03-23 NOTE — ED Provider Notes (Signed)
[facility] Emergency Department Provider Note   ____________________________________________   Event Date/Time   First MD Initiated Contact with Patient 03/23/21 1134     (approximate)  I have reviewed the triage vital signs and the nursing notes.   HISTORY  Chief Complaint Headache   HPI Emily Zhang is a 35 y.o. female presents to the ED with complaint of headache that started at 6 PM last night.  Patient has some nausea but no vomiting.  She denies any previous history of migraines or head injury.  Patient denies any visual changes, fever or chills.  She states she took Tylenol at 9 AM today which is helped for about an hour before her headache started.  She rates her pain as 7 out of 10.       Past Medical History:  Diagnosis Date  . Allergic rhinitis   . Arthritis 2019   Knee  . Bone spur 05/2017   cervical  . Obesity   . Ovarian cyst   . Viral pneumonia 12/2014    Patient Active Problem List   Diagnosis Date Noted  . Obesity   . Depressed mood 06/05/2017  . Sebaceous cyst 06/05/2017    Past Surgical History:  Procedure Laterality Date  . DENTAL SURGERY  2013; 12/2016   WISDOM TEETH EXTRACTION    Prior to Admission medications   Medication Sig Start Date End Date Taking? Authorizing Provider  butalbital-acetaminophen-caffeine (FIORICET) 50-325-40 MG tablet Take 1-2 tablets by mouth every 6 (six) hours as needed for headache. 03/23/21 03/23/22 Yes Madalyn Rob, Rhone Ozaki L, PA-C  cyclobenzaprine (FLEXERIL) 5 MG tablet Take 5 mg by mouth 3 (three) times daily as needed for muscle spasms.    [provider]  meloxicam (MOBIC) 7.5 MG tablet Take 7.5 mg by mouth daily.    [provider]  Multiple Vitamin (MULTIVITAMIN) tablet Take 1 tablet by mouth daily.    [provider]    Allergies Hydrocodone-acetaminophen and Vicodin [hydrocodone-acetaminophen]  Family History  Problem Relation Age of Onset  .  Colon cancer Maternal Uncle 25  . Lung cancer Maternal Uncle   . Melanoma Maternal Grandmother 35  . Lung cancer Maternal Grandfather   . Heart disease Maternal Grandfather        CARDIAC ARREST  . Colon cancer Maternal Grandfather 25  . Diabetes Paternal Grandfather   . Endometriosis Cousin        MATERNAL 1ST COUSIN    Social History Social History   Tobacco Use  . Smoking status: Former Smoker    Quit date: 05/2014    Years since quitting: 6.8  . Smokeless tobacco: Never Used  Vaping Use  . Vaping Use: Never used  Substance Use Topics  . Alcohol use: Yes    Comment: 2-3/WK  . Drug use: No    Review of Systems Constitutional: No fever/chills Eyes: No visual changes. ENT: No sore throat. Cardiovascular: Denies chest pain. Respiratory: Denies shortness of breath. Gastrointestinal: No abdominal pain.  No nausea, no vomiting.  Genitourinary: Negative for dysuria. Musculoskeletal: Negative for muscle aches. Skin: Negative for rash. Neurological: Positive for headache.  Negative for focal weakness or numbness. ____________________________________________   PHYSICAL EXAM:  VITAL SIGNS: ED Triage Vitals  Enc Vitals Group     BP 03/23/21 1133 (!) 117/54     Pulse Rate 03/23/21 1133 (!) 124     Resp 03/23/21 1133 18     Temp 03/23/21 1133 98.3 F (36.8 C)  Temp Source 03/23/21 1133 Oral     SpO2 03/23/21 1133 99 %     Weight 03/23/21 1134 240 lb (108.9 kg)     Height 03/23/21 1134 5\' 3"  (1.6 m)     Head Circumference --      Peak Flow --      Pain Score 03/23/21 1134 7     Pain Loc --      Pain Edu? --      Excl. in [place]? --     Constitutional: Alert and oriented. Well appearing and in no acute distress. Eyes: Conjunctivae are normal. PERRL. EOMI. no photophobia at present. Head: Atraumatic. Nose: No congestion/rhinnorhea. Mouth/Throat: Mucous membranes are moist.  Oropharynx non-erythematous.  No exudate and uvula is midline. Neck: No stridor.  No  tenderness on palpation of cervical spine posteriorly. Cardiovascular: Normal rate, regular rhythm. Grossly normal heart sounds.  Good peripheral circulation. Respiratory: Normal respiratory effort.  No retractions. Lungs CTAB. Gastrointestinal: Soft and nontender. No distention.  Musculoskeletal: Tenderness is noted on palpation of thoracic or lumbar spine.  Patient is able move upper and lower extremities without any difficulty.  Normal gait was noted.  Muscle strength bilaterally. Neurologic:  Normal speech and language.  Cranial nerves II through XII grossly intact.  No gross focal neurologic deficits are appreciated. No gait instability. Skin:  Skin is warm, dry and intact. No rash noted. Psychiatric: Mood and affect are normal. Speech and behavior are normal.  ____________________________________________   LABS (all labs ordered are listed, but only abnormal results are displayed)  Labs Reviewed - No data to display ____________________________________________ ___________________________________________  RADIOLOGY Leana Gamer, personally viewed and evaluated these images (plain radiographs) as part of my medical decision making, as well as reviewing the written report by the radiologist.   Official radiology report(s): CT Head Wo Contrast  Result Date: 03/23/2021 CLINICAL DATA:  Headache.  Nausea without vomiting. EXAM: CT HEAD WITHOUT CONTRAST TECHNIQUE: Contiguous axial images were obtained from the base of the skull through the vertex without intravenous contrast. COMPARISON:  None. FINDINGS: Brain: No subdural, epidural, or subarachnoid hemorrhage. Ventricles and sulci are normal cerebellum, brainstem, and basal cisterns are unremarkable. No mass effect or midline shift. No acute cortical ischemia or infarct. No significant white matter changes. Vascular: No hyperdense vessel or unexpected calcification. Skull: Normal. Negative for fracture or focal lesion. Sinuses/Orbits:  No acute finding. Other: None. IMPRESSION: No acute intracranial abnormalities. No cause for the patient's headache identified. Electronically Signed   By: Dorise Bullion III M.D   On: 03/23/2021 12:47    ____________________________________________   PROCEDURES  Procedure(s) performed (including Critical Care):  Procedures   ____________________________________________   INITIAL IMPRESSION / ASSESSMENT AND PLAN / ED COURSE  As part of my medical decision making, I reviewed the following data within the electronic MEDICAL RECORD NUMBER Notes from prior ED visits and  Controlled Substance Database  35 year old female presents to the ED with complaint of headache that started at 6 PM last night.  Patient states that she does not have a history of migraines and no prior head injury.  She has had some nausea but no vomiting.  She took Tylenol at 9 AM with mild relief for approximately 1 hour.  She reports some blurred vision.  Physical exam was unremarkable.  CT scan was reassuring and patient was made aware.  Because patient drove to the ED a prescription for Fioricet 1 or 2 tablets every 6 hours as needed for headache  was sent to her pharmacy.  She is aware that she cannot drive or operate machinery while taking this medication.  She is encouraged to drink fluids frequently to stay hydrated.  She is to follow-up with her PCP if any continued problems.  She is to return to the emergency department if any severe worsening of her symptoms.  ____________________________________________   FINAL CLINICAL IMPRESSION(S) / ED DIAGNOSES  Final diagnoses:  Acute nonintractable headache, unspecified headache type     ED Discharge Orders         Ordered    butalbital-acetaminophen-caffeine (FIORICET) 50-325-40 MG tablet  Every 6 hours PRN        03/23/21 1311          *Please note:  Shakira Los was evaluated in Emergency Department on 03/23/2021 for the symptoms described in the  history of present illness. She was evaluated in the context of the global COVID-19 pandemic, which necessitated consideration that the patient might be at risk for infection with the SARS-CoV-2 virus that causes COVID-19. Institutional protocols and algorithms that pertain to the evaluation of patients at risk for COVID-19 are in a state of rapid change based on information released by regulatory bodies including the CDC and federal and state organizations. These policies and algorithms were followed during the patient's care in the ED.  Some ED evaluations and interventions may be delayed as a result of limited staffing during and the pandemic.*   Note:  This document was prepared using Dragon voice recognition software and may include unintentional dictation errors.    Johnn Hai, PA-C 03/23/21 1347    Vladimir Crofts, MD 03/23/21 [phone]

## 2021-03-23 NOTE — ED Notes (Signed)
Taken to CT.

## 2021-03-23 NOTE — ED Triage Notes (Signed)
Pt states that she has a headache that started at [facility] last night- pt has had some nausea, but no vomiting- denies hx of migraines- pt states she took tylenol at 9AM and it helped for about an hour but pt states it came back- pt denies recent injury

## 2021-03-23 NOTE — ED Notes (Signed)
See first nurse note. NAD.

## 2021-03-26 DIAGNOSIS — Z20822 Contact with and (suspected) exposure to covid-19: Secondary | ICD-10-CM | POA: Diagnosis not present

## 2021-05-07 ENCOUNTER — Encounter: Payer: Self-pay | Admitting: Family Medicine

## 2021-05-07 ENCOUNTER — Ambulatory Visit (INDEPENDENT_AMBULATORY_CARE_PROVIDER_SITE_OTHER): Payer: 59 | Admitting: Family Medicine

## 2021-05-07 ENCOUNTER — Other Ambulatory Visit: Payer: Self-pay

## 2021-05-07 VITALS — BP 121/74 | HR 68 | Temp 98.8°F | Resp 16 | Wt 245.6 lb

## 2021-05-07 DIAGNOSIS — H6983 Other specified disorders of Eustachian tube, bilateral: Secondary | ICD-10-CM | POA: Diagnosis not present

## 2021-05-07 NOTE — Progress Notes (Signed)
      Established patient visit   Patient: Emily Zhang   DOB: 1986-04-18   35 y.o. Female  MRN: [id number] Visit Date: 05/07/2021  Today's healthcare provider: Lelon Huh, MD   Chief Complaint  Patient presents with  . Ear Problem   Subjective    HPI  Ear pressure: Patient complains of pressure and crackling sounds in both ears. Symptoms are worse in the right ear. Cracking started 8 days ago and is slightly worse today. She denies any decrease in hearing or pain in ears. She does have seasonal allergies, but has been off her allergy medications for about a month. Have some sinus drainage.     Medications: Outpatient Medications Prior to Visit  Medication Sig  . Multiple Vitamin (MULTIVITAMIN) tablet Take 1 tablet by mouth daily.  . [DISCONTINUED] butalbital-acetaminophen-caffeine (FIORICET) 50-325-40 MG tablet Take 1-2 tablets by mouth every 6 (six) hours as needed for headache.  . [DISCONTINUED] cyclobenzaprine (FLEXERIL) 5 MG tablet Take 5 mg by mouth 3 (three) times daily as needed for muscle spasms.  . [DISCONTINUED] meloxicam (MOBIC) 7.5 MG tablet Take 7.5 mg by mouth daily.   No facility-administered medications prior to visit.    Review of Systems  Constitutional: Negative for appetite change, chills, fatigue and fever.  HENT: Negative for ear discharge and ear pain.        Ear fullness  Respiratory: Negative for chest tightness and shortness of breath.   Cardiovascular: Negative for chest pain and palpitations.  Gastrointestinal: Negative for abdominal pain, nausea and vomiting.  Neurological: Negative for dizziness and weakness.       Objective    BP 121/74 (BP Location: Left Arm, Patient Position: Sitting, Cuff Size: Large)   Pulse 68   Temp 98.8 F (37.1 C) (Temporal)   Resp 16   Wt 245 lb 9.6 oz (111.4 kg)   LMP  (Within Weeks)   BMI 43.51 kg/m     Physical Exam  \ General Appearance:    Obese female, alert, cooperative, in no  acute distress  HENT:   bilateral TM normal without fluid or infection, neck without nodes, throat normal without erythema or exudate, sinuses nontender and nasal mucosa pale and congested  Eyes:    PERRL, conjunctiva/corneas clear, EOM's intact       Lungs:     Clear to auscultation bilaterally, respirations unlabored  Heart:    Normal heart rate. Normal rhythm. No murmurs, rubs, or gallops.   Neurologic:   Awake, alert, oriented x 3. No apparent focal neurological           defect.        Assessment & Plan     1. Dysfunction of both eustachian tubes Likely secondary to allergies or sequela of Covid infection. Recommend she get back on Xyzal for a few weeks or try OTC sudafed.         The entirety of the information documented in the History of Present Illness, Review of Systems and Physical Exam were personally obtained by me. Portions of this information were initially documented by the CMA and reviewed by me for thoroughness and accuracy.      Lelon Huh, MD  [facility] [phone] (phone) [phone] (fax)  [place]

## 2021-05-07 NOTE — Patient Instructions (Signed)

## 2021-06-27 NOTE — Progress Notes (Signed)
PCP:  Trinna Post, PA-C   Chief Complaint  Patient presents with   Gynecologic Exam    Has been having irregular bleeding since May, big clots     HPI:      Ms. Emily Zhang is a 35 y.o. [id number] whose LMP was Patient's last menstrual period was 06/15/2021 (exact date)., presents today for her annual examination.  Her menses are regular every 28-30 days, lasting 5-6 days, sometimes heavier flow than others, changing super plus tampons hourly, usually with larger clots. Doesn't happen every cycle. Has dysmen, improved with tylenol, no BTB. Had menses Q2-3 wks for 2 months 5/22 and 6/22 after having Covid, flow heavy, with clots and increased dysmen. LMP was back to monthly and normal flow.  Did Paragard IUD in past, had wt gain with OCPs.  Pt is currently sex active, contraception--condoms. Declines other BC. Not interested in future pregnancy.  Last Pap: 06/18/18  Results were: no abnormalities /neg HPV DNA Hx of STDs: none  There is no FH of breast cancer. There is no FH of ovarian cancer. The patient does do self-breast exams. FH colon cancer on mat side, will need colonoscopy early.  Tobacco use: The patient denies current or previous tobacco use. Alcohol use: social drinker No drug use.  Exercise: moderately active  She does get adequate calcium and Vitamin D in her diet.  Past Medical History:  Diagnosis Date   Allergic rhinitis    Arthritis 2019   Knee   Bone spur 05/2017   cervical   Obesity    Ovarian cyst    Viral pneumonia 12/2014    Past Surgical History:  Procedure Laterality Date   DENTAL SURGERY  2013; 12/2016   WISDOM TEETH EXTRACTION    Family History  Problem Relation Age of Onset   Colon cancer Maternal Uncle 66   Lung cancer Maternal Uncle    Melanoma Maternal Grandmother 71   Lung cancer Maternal Grandfather    Heart disease Maternal Grandfather        CARDIAC ARREST   Colon cancer Maternal Grandfather 90   Diabetes Paternal  Grandfather    Endometriosis Cousin        MATERNAL 1ST COUSIN    Social History   Socioeconomic History   Marital status: Married    Spouse name: [name]   Number of children: 2   Years of education: 12   Highest education level: Not on file  Occupational History   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  Tobacco Use   Smoking status: Former    Types: Cigarettes    Quit date: 05/2014    Years since quitting: 7.1   Smokeless tobacco: Never  Vaping Use   Vaping Use: Never used  Substance and Sexual Activity   Alcohol use: Yes    Comment: 2-3/WK   Drug use: No   Sexual activity: Yes    Partners: Male    Birth control/protection: None, Condom  Other Topics Concern   Not on file  Social History Narrative   Not on file   Social Determinants of Health   Financial Resource Strain: Not on file  Food Insecurity: Not on file  Transportation Needs: Not on file  Physical Activity: Not on file  Stress: Not on file  Social Connections: Not on file  Intimate Partner Violence: Not on file     Current Outpatient Medications:    Multiple Vitamin (MULTIVITAMIN) tablet, Take 1 tablet  by mouth daily., Disp: , Rfl:      ROS:  Review of Systems  Constitutional:  Negative for fatigue, fever and unexpected weight change.  Respiratory:  Negative for cough, shortness of breath and wheezing.   Cardiovascular:  Negative for chest pain, palpitations and leg swelling.  Gastrointestinal:  Positive for constipation. Negative for blood in stool, diarrhea, nausea and vomiting.  Endocrine: Negative for cold intolerance, heat intolerance and polyuria.  Genitourinary:  Negative for dyspareunia, dysuria, flank pain, frequency, genital sores, hematuria, menstrual problem, pelvic pain, urgency, vaginal bleeding, vaginal discharge and vaginal pain.  Musculoskeletal:  Negative for back pain, joint swelling and myalgias.  Skin:  Negative for rash.  Neurological:  Positive for  headaches. Negative for dizziness, syncope, light-headedness and numbness.  Hematological:  Negative for adenopathy.  Psychiatric/Behavioral:  Positive for agitation. Negative for confusion, sleep disturbance and suicidal ideas. The patient is not nervous/anxious.   BREAST: No symptoms   Objective: BP 110/70   Ht '5\' 3"'$  (1.6 m)   Wt 248 lb (112.5 kg)   LMP 06/15/2021 (Exact Date)   BMI 43.93 kg/m    Physical Exam Constitutional:      Appearance: She is well-developed.  Genitourinary:     Vulva normal.     Right Labia: No rash, tenderness or lesions.    Left Labia: No tenderness, lesions or rash.    No vaginal discharge, erythema or tenderness.      Right Adnexa: not tender and no mass present.    Left Adnexa: not tender and no mass present.    No cervical friability or polyp.     Uterus is not enlarged or tender.  Breasts:    Right: No mass, nipple discharge, skin change or tenderness.     Left: No mass, nipple discharge, skin change or tenderness.  Neck:     Thyroid: No thyromegaly.  Cardiovascular:     Rate and Rhythm: Normal rate and regular rhythm.     Heart sounds: Normal heart sounds. No murmur heard. Pulmonary:     Effort: Pulmonary effort is normal.     Breath sounds: Normal breath sounds.  Abdominal:     Palpations: Abdomen is soft.     Tenderness: There is no abdominal tenderness. There is no guarding or rebound.  Musculoskeletal:        General: Normal range of motion.     Cervical back: Normal range of motion.  Lymphadenopathy:     Cervical: No cervical adenopathy.  Neurological:     General: No focal deficit present.     Mental Status: She is alert and oriented to person, place, and time.     Cranial Nerves: No cranial nerve deficit.  Skin:    General: Skin is warm and dry.  Psychiatric:        Mood and Affect: Mood normal.        Behavior: Behavior normal.        Thought Content: Thought content normal.        Judgment: Judgment normal.  Vitals  reviewed.    Assessment/Plan: Encounter for annual routine gynecological examination  Menorrhagia with regular cycle--some months more than others. Discussed IUD, ablation, lysteda. Pt to consider and f/u if desires. Also suggested GYN u/s if sx persist (or AUB recurs) to rule out leio. F/u prn.     GYN counsel adequate intake of calcium and vitamin D, diet and exercise     F/U  Return in about 1 year (around 07/01/2022).  Emily Sellinger B. Shelsea Hangartner, PA-C 07/01/2021 2:56 PM

## 2021-07-01 ENCOUNTER — Other Ambulatory Visit: Payer: Self-pay

## 2021-07-01 ENCOUNTER — Encounter: Payer: Self-pay | Admitting: Obstetrics and Gynecology

## 2021-07-01 ENCOUNTER — Ambulatory Visit (INDEPENDENT_AMBULATORY_CARE_PROVIDER_SITE_OTHER): Payer: 59 | Admitting: Obstetrics and Gynecology

## 2021-07-01 VITALS — BP 110/70 | Ht 63.0 in | Wt 248.0 lb

## 2021-07-01 DIAGNOSIS — Z01419 Encounter for gynecological examination (general) (routine) without abnormal findings: Secondary | ICD-10-CM | POA: Diagnosis not present

## 2021-07-01 DIAGNOSIS — N920 Excessive and frequent menstruation with regular cycle: Secondary | ICD-10-CM | POA: Diagnosis not present

## 2021-07-01 NOTE — Patient Instructions (Signed)
I value your feedback and you entrusting us with your care. If you get a [facility] patient survey, I would appreciate you taking the time to let us know about your experience today. Thank you! ? ? ?

## 2021-07-23 DIAGNOSIS — K099 Cyst of oral region, unspecified: Secondary | ICD-10-CM | POA: Diagnosis not present

## 2021-08-27 DIAGNOSIS — K099 Cyst of oral region, unspecified: Secondary | ICD-10-CM | POA: Diagnosis not present

## 2021-08-27 DIAGNOSIS — D1039 Benign neoplasm of other parts of mouth: Secondary | ICD-10-CM | POA: Diagnosis not present

## 2022-10-13 NOTE — Progress Notes (Unsigned)
Complete physical exam   Patient: Emily Zhang   DOB: June 30, 1986   36 y.o. Female  MRN: [id number] Visit Date: 10/15/2022  Today's healthcare provider: Gwyneth Sprout, FNP  Patient presents for new patient visit to establish care.  Introduced to Designer, jewellery role and practice setting.  All questions answered.  Discussed provider/patient relationship and expectations.   I,Kimberlyn Quiocho J Yerick Eggebrecht,acting as a scribe for Gwyneth Sprout, FNP.,have documented all relevant documentation on the behalf of Gwyneth Sprout, FNP,as directed by  Gwyneth Sprout, FNP while in the presence of Gwyneth Sprout, FNP.   Chief Complaint  Patient presents with   Annual Exam   Subjective    Emily Zhang is a 36 y.o. female who presents today for a complete physical exam.  She reports consuming a general diet. The patient does not participate in regular exercise at present. She generally feels well. She reports sleeping well. She does have additional problems to discuss today. Possible high cholesterol and weight loss medication. 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  Tobacco Use   Smoking status: Former    Types: Cigarettes    Quit date: 05/2014    Years since quitting: 8.4   Smokeless tobacco: Never   Tobacco comments:    1 pack/week; 2003-2015  Vaping Use   Vaping Use: Never used  Substance and Sexual Activity   Alcohol use: Yes    Comment: 2-3/WK   Drug use:  No   Sexual activity: Yes    Partners: Male    Birth control/protection: None, Condom  Other Topics Concern   Not on file  Social History Narrative   Not on file   Social Determinants of Health   Financial Resource Strain: Not on file  Food Insecurity: Not on file  Transportation Needs: Not on file  Physical Activity: Not on file  Stress: Not on file  Social Connections: Not on file  Intimate Partner Violence: Not on file   Family Status  Relation Name Status   Mother  47, age 26y   MGM  Deceased   MGF  Deceased   [place]  Alive   PGF  Deceased   Mat Uncle  Deceased   Cousin  Alive   Family History  Problem Relation Age of Onset   Melanoma Maternal Grandmother 79   Lung cancer Maternal Grandfather    Heart disease Maternal Grandfather        CARDIAC ARREST   Colon cancer Maternal Grandfather 57   Diabetes Paternal Grandfather    Colon cancer Maternal Uncle 1   Lung cancer Maternal Uncle    Endometriosis Cousin        MATERNAL 1ST COUSIN   Allergies  Allergen Reactions   Hydrocodone-Acetaminophen Rash   Vicodin [Hydrocodone-Acetaminophen] Rash    Patient Care  Team: Gwyneth Sprout, FNP as PCP - General (Family Medicine)   Medications: Outpatient Medications Prior to Visit  Medication Sig   Multiple Vitamin (MULTIVITAMIN) tablet Take 1 tablet by mouth daily.   No facility-administered medications prior to visit.    Review of Systems  Musculoskeletal:  Positive for neck pain and neck stiffness.  Neurological:  Positive for headaches.    Objective    BP 123/75 (BP Location: Left Arm, Patient Position: Sitting, Cuff Size: Large)   Pulse 68   Resp 16   Ht '5\' 3"'$  (1.6 m)   Wt 228 lb (103.4 kg)   SpO2 100%   BMI 40.39 kg/m   Physical Exam Vitals and nursing note reviewed.  Constitutional:      General: She is awake. She is not in acute distress.    Appearance: Normal appearance. She is well-developed and well-groomed. She is obese. She is not  ill-appearing, toxic-appearing or diaphoretic.  HENT:     Head: Normocephalic and atraumatic.     Jaw: There is normal jaw occlusion. No trismus, tenderness, swelling or pain on movement.     Right Ear: Hearing, tympanic membrane, ear canal and external ear normal. There is no impacted cerumen.     Left Ear: Hearing, tympanic membrane, ear canal and external ear normal. There is no impacted cerumen.     Nose: Nose normal. No congestion or rhinorrhea.     Right Turbinates: Not enlarged, swollen or pale.     Left Turbinates: Not enlarged, swollen or pale.     Right Sinus: No maxillary sinus tenderness or frontal sinus tenderness.     Left Sinus: No maxillary sinus tenderness or frontal sinus tenderness.     Mouth/Throat:     Lips: Pink.     Mouth: Mucous membranes are moist. No injury.     Tongue: No lesions.     Pharynx: Oropharynx is clear. Uvula midline. No pharyngeal swelling, oropharyngeal exudate, posterior oropharyngeal erythema or uvula swelling.     Tonsils: No tonsillar exudate or tonsillar abscesses.  Eyes:     General: Lids are normal. Lids are everted, no foreign bodies appreciated. Vision grossly intact. Gaze aligned appropriately. No allergic shiner or visual field deficit.       Right eye: No discharge.        Left eye: No discharge.     Extraocular Movements: Extraocular movements intact.     Conjunctiva/sclera: Conjunctivae normal.     Right eye: Right conjunctiva is not injected. No exudate.    Left eye: Left conjunctiva is not injected. No exudate.    Pupils: Pupils are equal, round, and reactive to light.  Neck:     Thyroid: No thyroid mass, thyromegaly or thyroid tenderness.     Vascular: No carotid bruit.     Trachea: Trachea normal.  Cardiovascular:     Rate and Rhythm: Normal rate and regular rhythm.     Pulses: Normal pulses.          Carotid pulses are 2+ on the right side and 2+ on the left side.      Radial pulses are 2+ on the right side and 2+ on the  left side.       Dorsalis pedis pulses are 2+ on the right side and 2+ on the left side.       Posterior tibial pulses are 2+ on the right side and 2+ on the left side.     Heart sounds: Normal heart sounds,  S1 normal and S2 normal. No murmur heard.    No friction rub. No gallop.  Pulmonary:     Effort: Pulmonary effort is normal. No respiratory distress.     Breath sounds: Normal breath sounds and air entry. No stridor. No wheezing, rhonchi or rales.  Chest:     Chest wall: No tenderness.  Abdominal:     General: Abdomen is flat. Bowel sounds are normal. There is no distension.     Palpations: Abdomen is soft. There is no mass.     Tenderness: There is no abdominal tenderness. There is no right CVA tenderness, left CVA tenderness, guarding or rebound.     Hernia: No hernia is present.  Genitourinary:    Comments: Exam deferred; denies complaints Musculoskeletal:        General: No swelling, tenderness, deformity or signs of injury. Normal range of motion.     Cervical back: Full passive range of motion without pain, normal range of motion and neck supple. No edema, rigidity or tenderness. No muscular tenderness.     Right lower leg: No edema.     Left lower leg: No edema.  Lymphadenopathy:     Cervical: No cervical adenopathy.     Right cervical: No superficial, deep or posterior cervical adenopathy.    Left cervical: No superficial, deep or posterior cervical adenopathy.  Skin:    General: Skin is warm and dry.     Capillary Refill: Capillary refill takes less than 2 seconds.     Coloration: Skin is not jaundiced or pale.     Findings: No bruising, erythema, lesion or rash.  Neurological:     General: No focal deficit present.     Mental Status: She is alert and oriented to person, place, and time. Mental status is at baseline.     GCS: GCS eye subscore is 4. GCS verbal subscore is 5. GCS motor subscore is 6.     Sensory: Sensation is intact. No sensory deficit.     Motor: Motor  function is intact. No weakness.     Coordination: Coordination is intact. Coordination normal.     Gait: Gait is intact. Gait normal.  Psychiatric:        Attention and Perception: Attention and perception normal.        Mood and Affect: Mood and affect normal.        Speech: Speech normal.        Behavior: Behavior normal. Behavior is cooperative.        Thought Content: Thought content normal.        Cognition and Memory: Cognition and memory normal.        Judgment: Judgment normal.     Last depression screening scores    10/15/2022   11:22 AM 05/07/2021    1:34 PM  PHQ 2/9 Scores  PHQ - 2 Score 0 0  PHQ- 9 Score 2 1   Last fall risk screening    10/15/2022   11:21 AM  [place] in the past year? 0  Number falls in past yr: 0  Injury with Fall? 0  Risk for fall due to : No Fall Risks  Follow up Falls evaluation completed   Last Audit-C alcohol use screening    10/15/2022   11:22 AM  Alcohol Use Disorder Test (AUDIT)  1. How often do you have a drink containing alcohol? 2  2. How many drinks containing alcohol do you have on a typical  day when you are drinking? 0  3. How often do you have six or more drinks on one occasion? 0  AUDIT-C Score 2   A score of 3 or more in women, and 4 or more in men indicates increased risk for alcohol abuse, EXCEPT if all of the points are from question 1   No results found for any visits on 10/15/22.  Assessment & Plan    Routine Health Maintenance and Physical Exam  Exercise Activities and Dietary recommendations  Goals   None     Immunization History  Administered Date(s) Administered   Tdap 07/28/2011    Health Maintenance  Topic Date Due   Hepatitis C Screening  Never done   COVID-19 Vaccine (1) 10/31/2022 (Originally 03/13/1985)   INFLUENZA VACCINE  03/01/2023 (Originally 07/01/2022)   TETANUS/TDAP  10/16/2023 (Originally 07/27/2021)   PAP SMEAR-Modifier  06/19/2023   HIV Screening  Completed   HPV VACCINES   Aged Out    Discussed health benefits of physical activity, and encouraged her to engage in regular exercise appropriate for her age and condition.  Problem List Items Addressed This Visit       Other   Annual physical exam - Primary    UTD on dental Has not seen vision Things to do to keep yourself healthy  - Exercise at least 30-45 minutes a day, 3-4 days a week.  - Eat a low-fat diet with lots of fruits and vegetables, up to 7-9 servings per day.  - Seatbelts can save your life. Wear them always.  - Smoke detectors on every level of your home, check batteries every year.  - Eye Doctor - have an eye exam every 1-2 years  - Safe sex - if you may be exposed to STDs, use a condom.  - Alcohol -  If you drink, do it moderately, less than 2 drinks per day.  - [place]. Choose someone to speak for you if you are not able.  - Depression is common in our stressful world.If you're feeling down or losing interest in things you normally enjoy, please come in for a visit.  - Violence - If anyone is threatening or hurting you, please call immediately.       Relevant Orders   CBC with Differential/Platelet   Comprehensive Metabolic Panel (CMET)   Lipid panel   Hemoglobin A1c   TSH   Depression, recurrent (HCC)    Chronic, variable Start wellbutrin 150 to assist weight loss and assist with mood stabilization       Relevant Medications   buPROPion (WELLBUTRIN XL) 150 MG 24 hr tablet   Encounter for vitamin deficiency screening    Recommend check in s/o depression and obesity       Relevant Orders   Vitamin D (25 hydroxy)   B12 and Folate Panel   Family history of chronic ischemic heart disease   Relevant Orders   Comprehensive Metabolic Panel (CMET)   Lipid panel   Family history of diabetes mellitus   Relevant Orders   Hemoglobin A1c   Obesity    Chronic, stable Body mass index is 40.39 kg/m. Discussed importance of healthy weight management Discussed  diet and exercise       Relevant Medications   phentermine 37.5 MG capsule   buPROPion (WELLBUTRIN XL) 150 MG 24 hr tablet   Other Visit Diagnoses     Encounter for hepatitis C screening test for low risk patient  Relevant Orders   Hepatitis C Antibody      Return in about 3 months (around 01/15/2023) for medication administration.    Vonna Kotyk, FNP, have reviewed all documentation for this visit. The documentation on 10/15/22 for the exam, diagnosis, procedures, and orders are all accurate and complete.  Gwyneth Sprout, [place] [phone] (phone) [phone] (fax)  [place]

## 2022-10-15 ENCOUNTER — Other Ambulatory Visit: Payer: Self-pay

## 2022-10-15 ENCOUNTER — Ambulatory Visit (INDEPENDENT_AMBULATORY_CARE_PROVIDER_SITE_OTHER): Payer: 59 | Admitting: Family Medicine

## 2022-10-15 ENCOUNTER — Encounter: Payer: Self-pay | Admitting: Family Medicine

## 2022-10-15 VITALS — BP 123/75 | HR 68 | Resp 16 | Ht 63.0 in | Wt 228.0 lb

## 2022-10-15 DIAGNOSIS — Z1159 Encounter for screening for other viral diseases: Secondary | ICD-10-CM

## 2022-10-15 DIAGNOSIS — Z8249 Family history of ischemic heart disease and other diseases of the circulatory system: Secondary | ICD-10-CM

## 2022-10-15 DIAGNOSIS — F339 Major depressive disorder, recurrent, unspecified: Secondary | ICD-10-CM | POA: Diagnosis not present

## 2022-10-15 DIAGNOSIS — Z0000 Encounter for general adult medical examination without abnormal findings: Secondary | ICD-10-CM

## 2022-10-15 DIAGNOSIS — Z833 Family history of diabetes mellitus: Secondary | ICD-10-CM | POA: Diagnosis not present

## 2022-10-15 DIAGNOSIS — E6609 Other obesity due to excess calories: Secondary | ICD-10-CM | POA: Diagnosis not present

## 2022-10-15 DIAGNOSIS — Z6841 Body Mass Index (BMI) 40.0 and over, adult: Secondary | ICD-10-CM

## 2022-10-15 DIAGNOSIS — Z1321 Encounter for screening for nutritional disorder: Secondary | ICD-10-CM | POA: Diagnosis not present

## 2022-10-15 MED ORDER — PHENTERMINE HCL 37.5 MG PO CAPS
37.5000 mg | ORAL_CAPSULE | ORAL | 0 refills | Status: DC
  Filled 2022-10-15: qty 90, 90d supply, fill #0

## 2022-10-15 MED ORDER — BUPROPION HCL ER (XL) 150 MG PO TB24
150.0000 mg | ORAL_TABLET | Freq: Every day | ORAL | 1 refills | Status: DC
  Filled 2022-10-15: qty 90, 90d supply, fill #0
  Filled 2023-01-13: qty 90, 90d supply, fill #1

## 2022-10-15 NOTE — Assessment & Plan Note (Signed)
UTD on dental Has not seen vision Things to do to keep yourself healthy  - Exercise at least 30-45 minutes a day, 3-4 days a week.  - Eat a low-fat diet with lots of fruits and vegetables, up to 7-9 servings per day.  - Seatbelts can save your life. Wear them always.  - Smoke detectors on every level of your home, check batteries every year.  - Eye Doctor - have an eye exam every 1-2 years  - Safe sex - if you may be exposed to STDs, use a condom.  - Alcohol -  If you drink, do it moderately, less than 2 drinks per day.  - [place]. Choose someone to speak for you if you are not able.  - Depression is common in our stressful world.If you're feeling down or losing interest in things you normally enjoy, please come in for a visit.  - Violence - If anyone is threatening or hurting you, please call immediately.

## 2022-10-15 NOTE — Assessment & Plan Note (Signed)
Chronic, stable Body mass index is 40.39 kg/m. Discussed importance of healthy weight management Discussed diet and exercise

## 2022-10-15 NOTE — Assessment & Plan Note (Signed)
Recommend check in s/o depression and obesity

## 2022-10-15 NOTE — Assessment & Plan Note (Signed)
Chronic, variable Start wellbutrin 150 to assist weight loss and assist with mood stabilization

## 2022-10-16 ENCOUNTER — Encounter: Payer: Self-pay | Admitting: *Deleted

## 2022-10-16 ENCOUNTER — Other Ambulatory Visit: Payer: Self-pay

## 2022-10-16 LAB — LIPID PANEL
Chol/HDL Ratio: 4.2 ratio (ref 0.0–4.4)
Cholesterol, Total: 234 mg/dL — ABNORMAL HIGH (ref 100–199)
HDL: 56 mg/dL (ref >39)
LDL Chol Calc (NIH): 156 mg/dL — ABNORMAL HIGH (ref 0–99)
Triglycerides: 123 mg/dL (ref 0–149)
VLDL Cholesterol Cal: 22 mg/dL (ref 5–40)

## 2022-10-16 LAB — CBC WITH DIFFERENTIAL/PLATELET
Basophils Absolute: 0.1 10*3/uL (ref 0.0–0.2)
Basos: 1 %
EOS (ABSOLUTE): 0.3 10*3/uL (ref 0.0–0.4)
Eos: 5 %
Hematocrit: 37.5 % (ref 34.0–46.6)
Hemoglobin: 12.7 g/dL (ref 11.1–15.9)
Immature Grans (Abs): 0 10*3/uL (ref 0.0–0.1)
Immature Granulocytes: 0 %
Lymphocytes Absolute: 2.5 10*3/uL (ref 0.7–3.1)
Lymphs: 38 %
MCH: 28.5 pg (ref 26.6–33.0)
MCHC: 33.9 g/dL (ref 31.5–35.7)
MCV: 84 fL (ref 79–97)
Monocytes Absolute: 0.5 10*3/uL (ref 0.1–0.9)
Monocytes: 7 %
Neutrophils Absolute: 3.2 10*3/uL (ref 1.4–7.0)
Neutrophils: 49 %
Platelets: 308 10*3/uL (ref 150–450)
RBC: 4.45 x10E6/uL (ref 3.77–5.28)
RDW: 13.4 % (ref 11.7–15.4)
WBC: 6.5 10*3/uL (ref 3.4–10.8)

## 2022-10-16 LAB — HEPATITIS C ANTIBODY: Hep C Virus Ab: NONREACTIVE

## 2022-10-16 LAB — COMPREHENSIVE METABOLIC PANEL
ALT: 34 IU/L — ABNORMAL HIGH (ref 0–32)
AST: 28 IU/L (ref 0–40)
Albumin/Globulin Ratio: 2 (ref 1.2–2.2)
Albumin: 4.4 g/dL (ref 3.9–4.9)
Alkaline Phosphatase: 114 IU/L (ref 44–121)
BUN/Creatinine Ratio: 11 (ref 9–23)
BUN: 10 mg/dL (ref 6–20)
Bilirubin Total: 0.5 mg/dL (ref 0.0–1.2)
CO2: 25 mmol/L (ref 20–29)
Calcium: 10 mg/dL (ref 8.7–10.2)
Chloride: 101 mmol/L (ref 96–106)
Creatinine, Ser: 0.9 mg/dL (ref 0.57–1.00)
Globulin, Total: 2.2 g/dL (ref 1.5–4.5)
Glucose: 104 mg/dL — ABNORMAL HIGH (ref 70–99)
Potassium: 4.1 mmol/L (ref 3.5–5.2)
Sodium: 138 mmol/L (ref 134–144)
Total Protein: 6.6 g/dL (ref 6.0–8.5)
eGFR: 84 mL/min/{1.73_m2} (ref >59)

## 2022-10-16 LAB — HEMOGLOBIN A1C
Est. average glucose Bld gHb Est-mCnc: 108 mg/dL
Hgb A1c MFr Bld: 5.4 % (ref 4.8–5.6)

## 2022-10-16 LAB — B12 AND FOLATE PANEL
Folate: 17.3 ng/mL (ref >3.0)
Vitamin B-12: 864 pg/mL (ref 232–1245)

## 2022-10-16 LAB — TSH: TSH: 3.31 u[IU]/mL (ref 0.450–4.500)

## 2022-10-16 LAB — VITAMIN D 25 HYDROXY (VIT D DEFICIENCY, FRACTURES): Vit D, 25-Hydroxy: 28.7 ng/mL — ABNORMAL LOW (ref 30.0–100.0)

## 2022-10-16 NOTE — Progress Notes (Signed)
Labs look relatively good; borderline low Vit D level- can supplement daily with OTC dose of 5000 IU or we can do Rx dose once/week. Also, cholesterol is elevated both in "total" and "bad"/LDL. I continue to recommend diet low in saturated fat and regular exercise - 30 min at least 5 times per week

## 2022-10-17 ENCOUNTER — Encounter: Payer: Self-pay | Admitting: Family Medicine

## 2022-10-28 ENCOUNTER — Encounter: Payer: Self-pay | Admitting: Family Medicine

## 2022-12-10 ENCOUNTER — Encounter: Payer: Self-pay | Admitting: Family Medicine

## 2023-01-13 ENCOUNTER — Ambulatory Visit (INDEPENDENT_AMBULATORY_CARE_PROVIDER_SITE_OTHER): Payer: Commercial Managed Care - PPO | Admitting: Family Medicine

## 2023-01-13 ENCOUNTER — Encounter: Payer: Self-pay | Admitting: Family Medicine

## 2023-01-13 ENCOUNTER — Other Ambulatory Visit: Payer: Self-pay

## 2023-01-13 VITALS — BP 111/67 | HR 79 | Ht 63.0 in | Wt 217.2 lb

## 2023-01-13 DIAGNOSIS — Z6838 Body mass index (BMI) 38.0-38.9, adult: Secondary | ICD-10-CM

## 2023-01-13 DIAGNOSIS — Z7689 Persons encountering health services in other specified circumstances: Secondary | ICD-10-CM | POA: Diagnosis not present

## 2023-01-13 DIAGNOSIS — E6609 Other obesity due to excess calories: Secondary | ICD-10-CM

## 2023-01-13 MED ORDER — PHENTERMINE HCL 37.5 MG PO CAPS
37.5000 mg | ORAL_CAPSULE | ORAL | 0 refills | Status: DC
  Filled 2023-01-13: qty 90, 90d supply, fill #0

## 2023-01-13 NOTE — Assessment & Plan Note (Signed)
3 month check in Doing well Endorses some sleep concerns; however, wishes to continue to assist with weight mgmt goals

## 2023-01-13 NOTE — Progress Notes (Signed)
Established patient visit  Patient: Emily Zhang   DOB: 14-Oct-1986   37 y.o. Female  MRN: [id number] Visit Date: 01/13/2023  Today's healthcare provider: Gwyneth Sprout, FNP  Introduced to nurse practitioner role and practice setting.  All questions answered.  Discussed provider/patient relationship and expectations.  Subjective    WEIGHT MANAGEMENT  Duration: Previously worked with bariatric clinic for support- got from 250-208; however, stopped and had subsequent weight regain. Has been on phentermine with Wellbutrin to assist x3 months. Doing well; some difficulty falling asleep after late gym sessions. No anxiety, palpitations, BP elevations. Wishes to continue same dose. Will try OTC sleep teas to assist with falling asleep.  Previous attempts at weight loss: yes Complications of obesity: recurrent depression Peak weight: 250 lb Weight loss goal: 170-175 lb Weight loss to date:  Requesting obesity pharmacotherapy: yes Current weight loss supplements/medications: yes Previous weight loss supplements/meds: yes    Medications: Outpatient Medications Prior to Visit  Medication Sig   buPROPion (WELLBUTRIN XL) 150 MG 24 hr tablet Take 1 tablet (150 mg total) by mouth daily.   Multiple Vitamin (MULTIVITAMIN) tablet Take 1 tablet by mouth daily.   [DISCONTINUED] phentermine 37.5 MG capsule Take 1 capsule (37.5 mg total) by mouth every morning.   No facility-administered medications prior to visit.    Review of Systems    Objective    BP 111/67   Pulse 79   Ht 5' 3"$  (1.6 m)   Wt 217 lb 3.2 oz (98.5 kg)   LMP 12/24/2022   SpO2 100%   BMI 38.48 kg/m   Physical Exam Vitals and nursing note reviewed.  Constitutional:      General: She is not in acute distress.    Appearance: Normal appearance. She is obese. She is not ill-appearing, toxic-appearing or diaphoretic.  HENT:     Head: Normocephalic and atraumatic.  Cardiovascular:     Rate and Rhythm: Normal  rate and regular rhythm.     Pulses: Normal pulses.     Heart sounds: Normal heart sounds. No murmur heard.    No friction rub. No gallop.  Pulmonary:     Effort: Pulmonary effort is normal. No respiratory distress.     Breath sounds: Normal breath sounds. No stridor. No wheezing, rhonchi or rales.  Chest:     Chest wall: No tenderness.  Musculoskeletal:        General: No swelling, tenderness, deformity or signs of injury. Normal range of motion.     Right lower leg: No edema.     Left lower leg: No edema.  Skin:    General: Skin is warm and dry.     Capillary Refill: Capillary refill takes less than 2 seconds.     Coloration: Skin is not jaundiced or pale.     Findings: No bruising, erythema, lesion or rash.  Neurological:     General: No focal deficit present.     Mental Status: She is alert and oriented to person, place, and time. Mental status is at baseline.     Cranial Nerves: No cranial nerve deficit.     Sensory: No sensory deficit.     Motor: No weakness.     Coordination: Coordination normal.  Psychiatric:        Mood and Affect: Mood normal. Affect is flat.        Behavior: Behavior normal.        Thought Content: Thought content normal.  Judgment: Judgment normal.     No results found for any visits on 01/13/23.  Assessment & Plan     Problem List Items Addressed This Visit       Other   Encounter for weight management    3 month check in Doing well Endorses some sleep concerns; however, wishes to continue to assist with weight mgmt goals       Relevant Medications   phentermine 37.5 MG capsule   Obesity - Primary    Body mass index is 38.48 kg/m. Chronic, improved Continue to recommend balanced, lower carb meals. Smaller meal size, adding snacks. Choosing water as drink of choice and increasing purposeful exercise.       Relevant Medications   phentermine 37.5 MG capsule   Return in about 3 months (around 04/13/2023) for weight mgmt- OK  for virtual .     I, Gwyneth Sprout, FNP, have reviewed all documentation for this visit. The documentation on 01/13/23 for the exam, diagnosis, procedures, and orders are all accurate and complete.  Gwyneth Sprout, [place] [phone] (phone) [phone] (fax)  [place]

## 2023-01-13 NOTE — Assessment & Plan Note (Addendum)
Body mass index is 38.48 kg/m. Chronic, improved Continue to recommend balanced, lower carb meals. Smaller meal size, adding snacks. Choosing water as drink of choice and increasing purposeful exercise.

## 2023-01-14 ENCOUNTER — Other Ambulatory Visit: Payer: Self-pay

## 2023-04-13 ENCOUNTER — Encounter: Payer: Self-pay | Admitting: Family Medicine

## 2023-04-13 ENCOUNTER — Other Ambulatory Visit: Payer: Self-pay

## 2023-04-13 ENCOUNTER — Ambulatory Visit: Payer: Commercial Managed Care - PPO | Admitting: Family Medicine

## 2023-04-13 VITALS — BP 124/80 | HR 80 | Ht 63.0 in | Wt 217.0 lb

## 2023-04-13 DIAGNOSIS — E6609 Other obesity due to excess calories: Secondary | ICD-10-CM

## 2023-04-13 DIAGNOSIS — Z7689 Persons encountering health services in other specified circumstances: Secondary | ICD-10-CM

## 2023-04-13 DIAGNOSIS — Z6838 Body mass index (BMI) 38.0-38.9, adult: Secondary | ICD-10-CM | POA: Diagnosis not present

## 2023-04-13 DIAGNOSIS — F339 Major depressive disorder, recurrent, unspecified: Secondary | ICD-10-CM

## 2023-04-13 MED ORDER — BUPROPION HCL ER (XL) 150 MG PO TB24
150.0000 mg | ORAL_TABLET | Freq: Every day | ORAL | 4 refills | Status: DC
  Filled 2023-04-13: qty 90, 90d supply, fill #0
  Filled 2023-11-04: qty 90, 90d supply, fill #1
  Filled 2024-02-25: qty 90, 90d supply, fill #2

## 2023-04-13 MED ORDER — PHENTERMINE HCL 37.5 MG PO CAPS
37.5000 mg | ORAL_CAPSULE | ORAL | 0 refills | Status: DC
  Filled 2023-04-13: qty 90, 90d supply, fill #0

## 2023-04-13 NOTE — Assessment & Plan Note (Signed)
Chronic, reports pant size reduction from 22 to 16; weight over past year has lost 12.5% in past 9 months Discussed importance of healthy weight management Discussed diet and exercise

## 2023-04-13 NOTE — Assessment & Plan Note (Signed)
Pt has managed to lose some inches; however, no difference in scale. Also endorses going out to eat for [place] with her family for Mother's day; potential fluid shifts d/t sodium intake. Body mass index is 38.44 kg/m. Discussed importance of healthy weight management Discussed diet and exercise

## 2023-04-13 NOTE — Assessment & Plan Note (Signed)
Chronic, well controlled with use of wellbutrin 150 mg daily Defer titration at this time Does endorse some 'moodiness' around her cycle; declines discontinuation at this time PHQ review relatively stable

## 2023-04-13 NOTE — Progress Notes (Signed)
Established patient visit   Patient: Emily Zhang   DOB: 02-28-1986   37 y.o. Female  MRN: [id number] Visit Date: 04/13/2023  Today's healthcare provider: Jacky Kindle, FNP   Re Introduced to nurse practitioner role and practice setting.  All questions answered.  Discussed provider/patient relationship and expectations.   Subjective    HPI HPI   Pt stated--since taking medication have more energy though out the day. Last edited by Shelly Bombard, CMA on 04/13/2023  9:11 AM.      Slight weight loss; non-significant. Continue medication for an additional 3 months; recommend discontinuation if not improved at next follow up.   Medications: Outpatient Medications Prior to Visit  Medication Sig   Multiple Vitamin (MULTIVITAMIN) tablet Take 1 tablet by mouth daily.   [DISCONTINUED] buPROPion (WELLBUTRIN XL) 150 MG 24 hr tablet Take 1 tablet (150 mg total) by mouth daily.   [DISCONTINUED] phentermine 37.5 MG capsule Take 1 capsule (37.5 mg total) by mouth every morning.   No facility-administered medications prior to visit.    Review of Systems  Last CBC Lab Results  Component Value Date   WBC 6.5 10/15/2022   HGB 12.7 10/15/2022   HCT 37.5 10/15/2022   MCV 84 10/15/2022   MCH 28.5 10/15/2022   RDW 13.4 10/15/2022   PLT 308 10/15/2022   Last metabolic panel Lab Results  Component Value Date   GLUCOSE 104 (H) 10/15/2022   NA 138 10/15/2022   K 4.1 10/15/2022   CL 101 10/15/2022   CO2 25 10/15/2022   BUN 10 10/15/2022   CREATININE 0.90 10/15/2022   EGFR 84 10/15/2022   CALCIUM 10.0 10/15/2022   PROT 6.6 10/15/2022   ALBUMIN 4.4 10/15/2022   LABGLOB 2.2 10/15/2022   AGRATIO 2.0 10/15/2022   BILITOT 0.5 10/15/2022   ALKPHOS 114 10/15/2022   AST 28 10/15/2022   ALT 34 (H) 10/15/2022   ANIONGAP 8 03/21/2018   Last lipids Lab Results  Component Value Date   CHOL 234 (H) 10/15/2022   HDL 56 10/15/2022   LDLCALC 156 (H) 10/15/2022   TRIG 123  10/15/2022   CHOLHDL 4.2 10/15/2022   Last hemoglobin A1c Lab Results  Component Value Date   HGBA1C 5.4 10/15/2022   Last thyroid functions Lab Results  Component Value Date   TSH 3.310 10/15/2022   Last vitamin D Lab Results  Component Value Date   VD25OH 28.7 (L) 10/15/2022   Last vitamin B12 and Folate Lab Results  Component Value Date   VITAMINB12 864 10/15/2022   FOLATE 17.3 10/15/2022       Objective    BP 124/80 (BP Location: Left Arm, Patient Position: Sitting, Cuff Size: Large)   Pulse 80   Ht 5\' 3"  (1.6 m)   Wt 217 lb (98.4 kg)   SpO2 98%   BMI 38.44 kg/m   BP Readings from Last 3 Encounters:  04/13/23 124/80  01/13/23 111/67  10/15/22 123/75   Wt Readings from Last 3 Encounters:  04/13/23 217 lb (98.4 kg)  01/13/23 217 lb 3.2 oz (98.5 kg)  10/15/22 228 lb (103.4 kg)   SpO2 Readings from Last 3 Encounters:  04/13/23 98%  01/13/23 100%  10/15/22 100%   Physical Exam Vitals and nursing note reviewed.  Constitutional:      General: She is not in acute distress.    Appearance: Normal appearance. She is obese. She is not ill-appearing, toxic-appearing or diaphoretic.  HENT:  Head: Normocephalic and atraumatic.  Cardiovascular:     Rate and Rhythm: Normal rate and regular rhythm.     Pulses: Normal pulses.     Heart sounds: Normal heart sounds. No murmur heard.    No friction rub. No gallop.  Pulmonary:     Effort: Pulmonary effort is normal. No respiratory distress.     Breath sounds: Normal breath sounds. No stridor. No wheezing, rhonchi or rales.  Chest:     Chest wall: No tenderness.  Musculoskeletal:        General: No swelling, tenderness, deformity or signs of injury. Normal range of motion.     Right lower leg: No edema.     Left lower leg: No edema.  Skin:    General: Skin is warm and dry.     Capillary Refill: Capillary refill takes less than 2 seconds.     Coloration: Skin is not jaundiced or pale.     Findings: No  bruising, erythema, lesion or rash.  Neurological:     General: No focal deficit present.     Mental Status: She is alert and oriented to person, place, and time. Mental status is at baseline.     Cranial Nerves: No cranial nerve deficit.     Sensory: No sensory deficit.     Motor: No weakness.     Coordination: Coordination normal.  Psychiatric:        Mood and Affect: Mood normal.        Behavior: Behavior normal.        Thought Content: Thought content normal.        Judgment: Judgment normal.     No results found for any visits on 04/13/23.  Assessment & Plan     Problem List Items Addressed This Visit       Other   Depression, recurrent (HCC)    Chronic, well controlled with use of wellbutrin 150 mg daily Defer titration at this time Does endorse some 'moodiness' around her cycle; declines discontinuation at this time PHQ review relatively stable      Relevant Medications   buPROPion (WELLBUTRIN XL) 150 MG 24 hr tablet   Encounter for weight management - Primary    Pt has managed to lose some inches; however, no difference in scale. Also endorses going out to eat for [place] with her family for Mother's day; potential fluid shifts d/t sodium intake. Body mass index is 38.44 kg/m. Discussed importance of healthy weight management Discussed diet and exercise       Relevant Medications   phentermine 37.5 MG capsule   Obesity    Chronic, reports pant size reduction from 22 to 16; weight over past year has lost 12.5% in past 9 months Discussed importance of healthy weight management Discussed diet and exercise       Relevant Medications   phentermine 37.5 MG capsule   buPROPion (WELLBUTRIN XL) 150 MG 24 hr tablet   Return in about 3 months (around 07/14/2023) for weight mgmt .     Leilani Merl, FNP, have reviewed all documentation for this visit. The documentation on 04/13/23 for the exam, diagnosis, procedures, and orders are all accurate and  complete.  Jacky Kindle, FNP  [facility] [phone] (phone) [phone] (fax)  [facility]

## 2023-10-26 DIAGNOSIS — M13861 Other specified arthritis, right knee: Secondary | ICD-10-CM | POA: Diagnosis not present

## 2023-10-26 DIAGNOSIS — M25461 Effusion, right knee: Secondary | ICD-10-CM | POA: Diagnosis not present

## 2023-12-18 DIAGNOSIS — M25561 Pain in right knee: Secondary | ICD-10-CM | POA: Diagnosis not present

## 2023-12-23 DIAGNOSIS — S83281A Other tear of lateral meniscus, current injury, right knee, initial encounter: Secondary | ICD-10-CM | POA: Diagnosis not present

## 2023-12-23 DIAGNOSIS — S83241A Other tear of medial meniscus, current injury, right knee, initial encounter: Secondary | ICD-10-CM | POA: Diagnosis not present

## 2023-12-31 ENCOUNTER — Other Ambulatory Visit: Payer: Self-pay

## 2023-12-31 DIAGNOSIS — Z01818 Encounter for other preprocedural examination: Secondary | ICD-10-CM | POA: Diagnosis not present

## 2023-12-31 MED ORDER — ONDANSETRON 4 MG PO TBDP
4.0000 mg | ORAL_TABLET | Freq: Four times a day (QID) | ORAL | 0 refills | Status: DC | PRN
  Filled 2023-12-31: qty 15, 4d supply, fill #0

## 2023-12-31 MED ORDER — ASPIRIN 81 MG PO TBEC
81.0000 mg | DELAYED_RELEASE_TABLET | Freq: Two times a day (BID) | ORAL | 0 refills | Status: DC
  Filled 2023-12-31: qty 60, 30d supply, fill #0

## 2023-12-31 MED ORDER — IBUPROFEN 800 MG PO TABS
800.0000 mg | ORAL_TABLET | Freq: Three times a day (TID) | ORAL | 0 refills | Status: DC
  Filled 2023-12-31: qty 30, 10d supply, fill #0

## 2023-12-31 MED ORDER — TRAMADOL HCL 50 MG PO TABS
50.0000 mg | ORAL_TABLET | Freq: Four times a day (QID) | ORAL | 0 refills | Status: AC
  Filled 2023-12-31: qty 20, 5d supply, fill #0

## 2023-12-31 MED ORDER — DOCUSATE SODIUM 100 MG PO CAPS
100.0000 mg | ORAL_CAPSULE | Freq: Two times a day (BID) | ORAL | 0 refills | Status: DC
  Filled 2023-12-31: qty 30, 15d supply, fill #0

## 2024-01-01 ENCOUNTER — Other Ambulatory Visit: Payer: Self-pay

## 2024-01-06 DIAGNOSIS — S83281A Other tear of lateral meniscus, current injury, right knee, initial encounter: Secondary | ICD-10-CM | POA: Diagnosis not present

## 2024-01-06 DIAGNOSIS — M23300 Other meniscus derangements, unspecified lateral meniscus, right knee: Secondary | ICD-10-CM | POA: Diagnosis not present

## 2024-01-06 DIAGNOSIS — M94261 Chondromalacia, right knee: Secondary | ICD-10-CM | POA: Diagnosis not present

## 2024-01-06 DIAGNOSIS — M2341 Loose body in knee, right knee: Secondary | ICD-10-CM | POA: Diagnosis not present

## 2024-01-06 DIAGNOSIS — M23303 Other meniscus derangements, unspecified medial meniscus, right knee: Secondary | ICD-10-CM | POA: Diagnosis not present

## 2024-01-06 DIAGNOSIS — M238X1 Other internal derangements of right knee: Secondary | ICD-10-CM | POA: Diagnosis not present

## 2024-01-06 DIAGNOSIS — S83231A Complex tear of medial meniscus, current injury, right knee, initial encounter: Secondary | ICD-10-CM | POA: Diagnosis not present

## 2024-01-06 HISTORY — PX: KNEE ARTHROSCOPY: SUR90

## 2024-01-11 DIAGNOSIS — M25561 Pain in right knee: Secondary | ICD-10-CM | POA: Diagnosis not present

## 2024-01-15 DIAGNOSIS — M25561 Pain in right knee: Secondary | ICD-10-CM | POA: Diagnosis not present

## 2024-01-19 DIAGNOSIS — M25561 Pain in right knee: Secondary | ICD-10-CM | POA: Diagnosis not present

## 2024-01-22 DIAGNOSIS — M25561 Pain in right knee: Secondary | ICD-10-CM | POA: Diagnosis not present

## 2024-01-25 DIAGNOSIS — M25561 Pain in right knee: Secondary | ICD-10-CM | POA: Diagnosis not present

## 2024-01-27 DIAGNOSIS — M25561 Pain in right knee: Secondary | ICD-10-CM | POA: Diagnosis not present

## 2024-02-03 DIAGNOSIS — M25561 Pain in right knee: Secondary | ICD-10-CM | POA: Diagnosis not present

## 2024-02-08 ENCOUNTER — Ambulatory Visit (INDEPENDENT_AMBULATORY_CARE_PROVIDER_SITE_OTHER): Admitting: Certified Nurse Midwife

## 2024-02-08 ENCOUNTER — Encounter: Payer: Self-pay | Admitting: Certified Nurse Midwife

## 2024-02-08 VITALS — BP 123/82 | HR 74 | Ht 63.0 in | Wt 246.3 lb

## 2024-02-08 DIAGNOSIS — N750 Cyst of Bartholin's gland: Secondary | ICD-10-CM | POA: Diagnosis not present

## 2024-02-08 NOTE — Progress Notes (Signed)
    GYNECOLOGY PROGRESS NOTE  Subjective:    Patient ID: Emily Zhang, female    DOB: 12/04/1985, 38 y.o.   MRN: [id number]  HPI  Patient is a 38 y.o. G62P1102 female who presents for evaluation of lump on inner left labia near vaginal opening that enlarges and becomes painful at the time of her last two cycles. It enlarged to a "little bigger than a pea." She did not notice any drainage from it. Denies fever or signs of infection.    Review of Systems Pertinent items are noted in HPI.   Objective:   Blood pressure 123/82, pulse 74, height 5\' 3"  (1.6 m), weight 246 lb 4.8 oz (111.7 kg). Body mass index is 43.63 kg/m. General appearance: alert and cooperative Abdomen: soft, non-tender; bowel sounds normal; no masses,  no organomegaly Pelvic: positive findings: rice size, movable mass in left inner labia    Assessment:   1. Bartholin cyst      Plan:   1. Bartholin cyst (Primary) -Expectant management for now. Reviewed warm compresses and sitz baths.  -Return to care with worsening symptoms or signs of infections. Discussed possibility of drainage if it comes back and remains large and painful.

## 2024-02-10 DIAGNOSIS — M25561 Pain in right knee: Secondary | ICD-10-CM | POA: Diagnosis not present

## 2024-02-17 DIAGNOSIS — M25561 Pain in right knee: Secondary | ICD-10-CM | POA: Diagnosis not present

## 2024-02-29 DIAGNOSIS — M25561 Pain in right knee: Secondary | ICD-10-CM | POA: Diagnosis not present

## 2024-05-11 ENCOUNTER — Ambulatory Visit: Payer: Self-pay | Admitting: Family Medicine

## 2024-05-31 ENCOUNTER — Encounter (INDEPENDENT_AMBULATORY_CARE_PROVIDER_SITE_OTHER): Payer: Self-pay

## 2024-05-31 ENCOUNTER — Ambulatory Visit: Admitting: Family Medicine

## 2024-05-31 ENCOUNTER — Other Ambulatory Visit: Payer: Self-pay

## 2024-05-31 ENCOUNTER — Encounter: Payer: Self-pay | Admitting: Family Medicine

## 2024-05-31 VITALS — BP 137/87 | HR 86 | Temp 98.3°F | Resp 16 | Ht 63.0 in | Wt 252.0 lb

## 2024-05-31 DIAGNOSIS — E559 Vitamin D deficiency, unspecified: Secondary | ICD-10-CM | POA: Diagnosis not present

## 2024-05-31 DIAGNOSIS — R030 Elevated blood-pressure reading, without diagnosis of hypertension: Secondary | ICD-10-CM | POA: Diagnosis not present

## 2024-05-31 DIAGNOSIS — F331 Major depressive disorder, recurrent, moderate: Secondary | ICD-10-CM | POA: Diagnosis not present

## 2024-05-31 DIAGNOSIS — Z130 Encounter for screening for diseases of the blood and blood-forming organs and certain disorders involving the immune mechanism: Secondary | ICD-10-CM

## 2024-05-31 MED ORDER — NALTREXONE HCL 50 MG PO TABS
25.0000 mg | ORAL_TABLET | Freq: Every day | ORAL | 1 refills | Status: DC
  Filled 2024-05-31: qty 45, 90d supply, fill #0

## 2024-05-31 MED ORDER — BUPROPION HCL ER (XL) 300 MG PO TB24
300.0000 mg | ORAL_TABLET | Freq: Every day | ORAL | 1 refills | Status: DC
  Filled 2024-05-31: qty 90, 90d supply, fill #0
  Filled 2024-09-05: qty 90, 90d supply, fill #1

## 2024-05-31 NOTE — Progress Notes (Signed)
 Established Patient Office Visit  Introduced to nurse practitioner role and practice setting.  All questions answered.  Discussed provider/patient relationship and expectations.  Subjective   Patient ID: Emily Zhang, female    DOB: 08-Aug-1986  Age: 38 y.o. MRN: [id number]  Chief Complaint  Patient presents with   Weight Management Screening   Medication Refill    Needs Wellbutrin    Discussed the use of AI scribe software for clinical note transcription with the patient, who gave verbal consent to proceed.  History of Present Illness Emily Zhang is a 38 year old female who presents for a refill of Wellbutrin  due to increased stress.  She has been experiencing increased stress, which has worsened recently. She has been taking Wellbutrin  consistently but stopped temporarily after knee surgery and a move. She is currently on a dose of 150 mg daily.  Weight mgmt: She has a history of knee surgery, which has impacted her ability to exercise. She has difficulty going to the gym due to knee issues and fatigue from sitting at a computer all day. She previously tried phentermine  for weight loss but experienced side effects such as dry mouth and bad breath, and only lost 20 pounds before regaining the weight. She also tried B12 shots at a weight loss clinic, which provided more energy but no significant weight loss. She feels defeated.  Her husband had a hemorrhagic stroke six years ago, resulting in significant caregiving responsibilities for her. He cannot drive or speak clearly, which adds to her stress. She has a daughter who was under two years old at the time of her husband's stroke.       05/31/2024   10:32 AM 04/13/2023    9:19 AM 01/13/2023   10:34 AM  Depression screen PHQ 2/9  Decreased Interest 0 0 0  Down, Depressed, Hopeless 0 0 0  PHQ - 2 Score 0 0 0  Altered sleeping 2 1 2   Tired, decreased energy 2 1 0  Change in appetite 1 1 1   Feeling bad or  failure about yourself  0 0 0  Trouble concentrating 0 0 0  Moving slowly or fidgety/restless 0 0 0  Suicidal thoughts 0 0 0  PHQ-9 Score 5 3 3   Difficult doing work/chores Somewhat difficult Not difficult at all Not difficult at all       05/31/2024   10:32 AM  GAD 7 : Generalized Anxiety Score  Nervous, Anxious, on Edge 0  Control/stop worrying 0  Worry too much - different things 0  Trouble relaxing 1  Restless 0  Easily annoyed or irritable 0  Afraid - awful might happen 0  Total GAD 7 Score 1  Anxiety Difficulty Somewhat difficult     Review of Systems  All other systems reviewed and are negative.   Negative unless indicated in HPI   Objective:     BP 137/87 (BP Location: Left Arm, Patient Position: Sitting, Cuff Size: Normal)   Pulse 86   Temp 98.3 F (36.8 C) (Oral)   Resp 16   Ht 5' 3 (1.6 m)   Wt 252 lb (114.3 kg)   SpO2 100%   BMI 44.64 kg/m    Physical Exam Constitutional:      General: She is not in acute distress.    Appearance: Normal appearance. She is obese. She is not toxic-appearing or diaphoretic.  HENT:     Head: Normocephalic.     Right Ear: Tympanic membrane, ear canal and external  ear normal.     Left Ear: Tympanic membrane, ear canal and external ear normal.     Nose: Nose normal.     Mouth/Throat:     Mouth: Mucous membranes are moist.     Pharynx: Oropharynx is clear.   Eyes:     Extraocular Movements: Extraocular movements intact.     Pupils: Pupils are equal, round, and reactive to light.    Cardiovascular:     Rate and Rhythm: Normal rate and regular rhythm.     Pulses: Normal pulses.     Heart sounds: Normal heart sounds. No murmur heard.    No friction rub. No gallop.  Pulmonary:     Effort: No respiratory distress.     Breath sounds: No stridor. No wheezing, rhonchi or rales.  Chest:     Chest wall: No tenderness.   Musculoskeletal:     Right lower leg: No edema.     Left lower leg: No edema.   Skin:     General: Skin is warm and dry.     Capillary Refill: Capillary refill takes less than 2 seconds.     Coloration: Skin is not jaundiced or pale.     Findings: No bruising, erythema, lesion or rash.   Neurological:     General: No focal deficit present.     Mental Status: She is alert and oriented to person, place, and time. Mental status is at baseline.     Cranial Nerves: No cranial nerve deficit.     Sensory: No sensory deficit.     Motor: No weakness.     Coordination: Coordination normal.     Gait: Gait normal.   Psychiatric:        Attention and Perception: Attention normal.        Mood and Affect: Affect normal. Mood is anxious.        Speech: Speech normal.        Behavior: Behavior normal.        Thought Content: Thought content normal.        Cognition and Memory: Cognition and memory normal.        Judgment: Judgment normal.      No results found for any visits on 05/31/24.    The ASCVD Risk score (Arnett DK, et al., 2019) failed to calculate for the following reasons:   The 2019 ASCVD risk score is only valid for ages 5 to 52    Assessment & Plan:  Moderate episode of recurrent major depressive disorder (HCC)  Morbid obesity (HCC) -     Amb Ref to Medical Weight Management -     TSH -     Hemoglobin A1c -     Comprehensive metabolic panel with GFR -     Lipid panel -     buPROPion  HCl ER (XL); Take 1 tablet (300 mg total) by mouth daily.  Dispense: 90 tablet; Refill: 1 -     Naltrexone HCl; Take 0.5 tablets (25 mg total) by mouth daily.  Dispense: 45 tablet; Refill: 1  Vitamin D  deficiency -     VITAMIN D  25 Hydroxy (Vit-D Deficiency, Fractures)  Screening for deficiency anemia -     CBC     Assessment and Plan Assessment & Plan Major Depressive Disorder Increased stress and life changes necessitate Wellbutrin  dose adjustment. - Increase Wellbutrin  to 300 mg daily. - Finish current 150 mg supply by taking two pills daily until depleted. - f/u 1-2  months  for mood check - May consider referral for CBT in future  Morbid Obesity Limited success with previous weight loss methods (phentermine ; b12 injections).  Discussed options - insurance most likely does not cover glp-1 Considering Wellbutrin  and naltrexone for weight mgmt.  Interested in specialist consultation. - Order naltrexone to be taken with Wellbutrin . - Refer to a nutritionist for dietary consultation. - Refer to a medical weight management specialist. - Stressed importance of daily physical activity - Discuss potential side effects and avoidance of ETOH with naltrexone. - will check lipids, A1c, cmp, tsh - f/u 1-2 months for weight check  Elevated blood pressure GOAL<119/79 Blood pressure elevated, likely stress-related.  Historically well-controlled. - limit sodium intake, increase daily activity, weight loss, well balanced diet  Vitamin D  Deficiency Recheck labs today Based on results- minimally recommend daily supplement, 1000ius  General Health Maintenance - recommend updating tdap - recommend pap   Return in about 2 months (around 08/01/2024).   I, Curtis DELENA Boom, FNP, have reviewed all documentation for this visit. The documentation on 05/31/24 for the exam, diagnosis, procedures, and orders are all accurate and complete.   Curtis DELENA Boom, FNP

## 2024-06-01 ENCOUNTER — Ambulatory Visit: Payer: Self-pay | Admitting: Family Medicine

## 2024-06-01 LAB — LIPID PANEL
Chol/HDL Ratio: 6.4 ratio — ABNORMAL HIGH (ref 0.0–4.4)
Cholesterol, Total: 256 mg/dL — ABNORMAL HIGH (ref 100–199)
HDL: 40 mg/dL
LDL Chol Calc (NIH): 184 mg/dL — ABNORMAL HIGH (ref 0–99)
Triglycerides: 170 mg/dL — ABNORMAL HIGH (ref 0–149)
VLDL Cholesterol Cal: 32 mg/dL (ref 5–40)

## 2024-06-01 LAB — COMPREHENSIVE METABOLIC PANEL WITH GFR
ALT: 19 IU/L (ref 0–32)
AST: 20 IU/L (ref 0–40)
Albumin: 4.1 g/dL (ref 3.9–4.9)
Alkaline Phosphatase: 125 IU/L — ABNORMAL HIGH (ref 44–121)
BUN/Creatinine Ratio: 10 (ref 9–23)
BUN: 9 mg/dL (ref 6–20)
Bilirubin Total: 0.5 mg/dL (ref 0.0–1.2)
CO2: 20 mmol/L (ref 20–29)
Calcium: 9.3 mg/dL (ref 8.7–10.2)
Chloride: 104 mmol/L (ref 96–106)
Creatinine, Ser: 0.89 mg/dL (ref 0.57–1.00)
Globulin, Total: 2.4 g/dL (ref 1.5–4.5)
Glucose: 103 mg/dL — ABNORMAL HIGH (ref 70–99)
Potassium: 4.4 mmol/L (ref 3.5–5.2)
Sodium: 140 mmol/L (ref 134–144)
Total Protein: 6.5 g/dL (ref 6.0–8.5)
eGFR: 85 mL/min/1.73

## 2024-06-01 LAB — CBC
Hematocrit: 39.7 % (ref 34.0–46.6)
Hemoglobin: 13.3 g/dL (ref 11.1–15.9)
MCH: 30 pg (ref 26.6–33.0)
MCHC: 33.5 g/dL (ref 31.5–35.7)
MCV: 90 fL (ref 79–97)
Platelets: 272 10*3/uL (ref 150–450)
RBC: 4.43 x10E6/uL (ref 3.77–5.28)
RDW: 12.6 % (ref 11.7–15.4)
WBC: 7.7 10*3/uL (ref 3.4–10.8)

## 2024-06-01 LAB — TSH: TSH: 4.39 u[IU]/mL (ref 0.450–4.500)

## 2024-06-01 LAB — HEMOGLOBIN A1C
Est. average glucose Bld gHb Est-mCnc: 105 mg/dL
Hgb A1c MFr Bld: 5.3 % (ref 4.8–5.6)

## 2024-06-01 LAB — VITAMIN D 25 HYDROXY (VIT D DEFICIENCY, FRACTURES): Vit D, 25-Hydroxy: 23.9 ng/mL — ABNORMAL LOW (ref 30.0–100.0)

## 2024-06-07 NOTE — Telephone Encounter (Signed)
 Called patient back to review question regarding vitamin D - patient states she picked up an otc vitamin D  supplement and is taking that.

## 2024-06-07 NOTE — Progress Notes (Signed)
 Noted TY

## 2024-06-15 ENCOUNTER — Encounter: Payer: Self-pay | Admitting: Emergency Medicine

## 2024-06-15 ENCOUNTER — Ambulatory Visit
Admission: EM | Admit: 2024-06-15 | Discharge: 2024-06-15 | Disposition: A | Discharge status: Home / Self Care | Attending: Family Medicine | Admitting: Family Medicine

## 2024-06-15 DIAGNOSIS — R110 Nausea: Secondary | ICD-10-CM | POA: Diagnosis not present

## 2024-06-15 DIAGNOSIS — R109 Unspecified abdominal pain: Secondary | ICD-10-CM | POA: Diagnosis not present

## 2024-06-15 DIAGNOSIS — R197 Diarrhea, unspecified: Secondary | ICD-10-CM | POA: Diagnosis not present

## 2024-06-15 DIAGNOSIS — R1013 Epigastric pain: Secondary | ICD-10-CM

## 2024-06-15 MED ORDER — FAMOTIDINE 20 MG PO TABS
20.0000 mg | ORAL_TABLET | Freq: Two times a day (BID) | ORAL | 0 refills | Status: DC
  Filled 2024-06-15: qty 30, 15d supply, fill #0

## 2024-06-15 MED ORDER — ALUM & MAG HYDROXIDE-SIMETH 200-200-20 MG/5ML PO SUSP
30.0000 mL | Freq: Once | ORAL | Status: AC
  Administered 2024-06-15: 30 mL via ORAL

## 2024-06-15 MED ORDER — OMEPRAZOLE 20 MG PO CPDR
20.0000 mg | DELAYED_RELEASE_CAPSULE | Freq: Two times a day (BID) | ORAL | 0 refills | Status: DC
  Filled 2024-06-15: qty 60, 30d supply, fill #0

## 2024-06-15 MED ORDER — LIDOCAINE VISCOUS HCL 2 % MT SOLN
15.0000 mL | Freq: Once | OROMUCOSAL | Status: AC
  Administered 2024-06-15: 15 mL via OROMUCOSAL

## 2024-06-15 NOTE — Discharge Instructions (Addendum)
 See handout regarding food choices to help relieve diarrhea. Increasing Welbutrin and Naltrexone  can cause diarrhea. Stop by the pharmacy to pick up your prescriptions.  Follow up with your primary care provider or return to the urgent care, if not improving.

## 2024-06-15 NOTE — ED Provider Notes (Signed)
 [facility]    CSN: [id number] Arrival date & time: 06/15/24  1821      History   Chief Complaint Chief Complaint  Patient presents with   Diarrhea   Nausea   Heartburn   Abdominal Pain    HPI Emily Zhang is a 38 y.o. female.   HPI  Emily Zhang presents for RUQ abdominal pain with nausea, bloating, acid reflux and diarrhea for the past week.  She made herself throw up. Reports light colored stools.  No melena or bloody stools.  She had diarrhea. She ate a salad and then 2 hours lettuce in the stool. Tried Pepto Bismol and Tylenol  without relief.  Pain doesn't radiate to her back. Drinking makes her nauseous and eating causes pain sometime.  The week leading up to her symptoms she had constipation.    Her bupropion  was increased from 150 to 300 mg.  She was prescribed Naltrexone  for weight loss 50 mg. She took it about a week then stopped.  She wrote everything down and noticed that she was taking a whole tablet instead of half a tablet.   No known family history of autoimmune disease.   Past Surgeries: no previous abdominal surgeries   Symptoms Nausea/Vomiting: yes  Diarrhea: yes  Constipation: yes  Melena/BRBPR: no  Hematemesis: no  Anorexia: yes  Fever/Chills: no  Dysuria: no  Rash: no  Wt loss: no  EtOH use: not in the past couple week   NSAIDs/ASA: no  LMP: Patient's last menstrual period was 06/10/2024. Sore throat: no   Cough: no Sleep disturbance: yes  Back Pain: no Headache: no   Past Medical History:  Diagnosis Date   Allergic rhinitis    Arthritis 2019   Knee   Bone spur 05/2017   cervical   Obesity    Ovarian cyst    Viral pneumonia 12/2014    Patient Active Problem List   Diagnosis Date Noted   Bartholin cyst 02/08/2024   Encounter for weight management 01/13/2023   Annual physical exam 10/15/2022   Depression, recurrent (HCC) 10/15/2022   Family history of chronic ischemic heart disease 10/15/2022   Family history  of diabetes mellitus 10/15/2022   Obesity     Past Surgical History:  Procedure Laterality Date   DENTAL SURGERY  2013; 12/2016   WISDOM TEETH EXTRACTION   KNEE ARTHROSCOPY Right 01/06/2024    OB History     Gravida  2   Para  2   Term  1   Preterm  1   AB      Living  2      SAB      IAB      Ectopic      Multiple  0   Live Births  2        Obstetric Comments  IOL for advanced dilation          Home Medications    Prior to Admission medications   Medication Sig Start Date End Date Taking? Authorizing Provider  buPROPion  (WELLBUTRIN  XL) 300 MG 24 hr tablet Take 1 tablet (300 mg total) by mouth daily. 05/31/24  Yes Clifton, Kellie A, FNP  famotidine  (PEPCID ) 20 MG tablet Take 1 tablet (20 mg total) by mouth 2 (two) times daily. 06/15/24  Yes Shalana Jardin, DO  omeprazole  (PRILOSEC) 20 MG capsule Take 1 capsule (20 mg total) by mouth 2 (two) times daily before a meal. 06/15/24  Yes Sylvania Moss, DO  Multiple Vitamin (  MULTIVITAMIN) tablet Take 1 tablet by mouth daily.    [provider]  naltrexone  (DEPADE) 50 MG tablet Take 0.5 tablets (25 mg total) by mouth daily. 05/31/24   Wellington Curtis LABOR, FNP  ondansetron  (ZOFRAN -ODT) 4 MG disintegrating tablet Take 1 tablet (4 mg total) by mouth every 8 (eight) hours as needed. 06/20/24   Margrette Monte A, PA-C    Family History Family History  Problem Relation Age of Onset   Melanoma Maternal Grandmother 11   Lung cancer Maternal Grandfather    Heart disease Maternal Grandfather        CARDIAC ARREST   Colon cancer Maternal Grandfather 73   Diabetes Paternal Grandfather    Colon cancer Maternal Uncle 40   Lung cancer Maternal Uncle    Endometriosis Cousin        MATERNAL 1ST COUSIN    Social History Social History   Tobacco Use   Smoking status: Former    Current packs/day: 0.00    Types: Cigarettes    Quit date: 05/2014    Years since quitting: 10.1   Smokeless tobacco: Never   Tobacco  comments:    1 pack/week; 2003-2015  Vaping Use   Vaping status: Never Used  Substance Use Topics   Alcohol use: Not Currently    Comment: 2-3/WK   Drug use: No     Allergies   Hydrocodone-acetaminophen , Codeine, and Vicodin [hydrocodone-acetaminophen ]   Review of Systems Review of Systems :negative unless otherwise stated in HPI.      Physical Exam Triage Vital Signs ED Triage Vitals  Encounter Vitals Group     BP 06/15/24 1910 131/89     Girls Systolic BP Percentile --      Girls Diastolic BP Percentile --      Boys Systolic BP Percentile --      Boys Diastolic BP Percentile --      Pulse Rate 06/15/24 1910 79     Resp 06/15/24 1910 16     Temp 06/15/24 1910 98.8 F (37.1 C)     Temp Source 06/15/24 1910 Oral     SpO2 06/15/24 1910 98 %     Weight --      Height --      Head Circumference --      Peak Flow --      Pain Score 06/15/24 1909 3     Pain Loc --      Pain Education --      Exclude from Growth Chart --    No data found.  Updated Vital Signs BP 131/89 (BP Location: Right Arm)   Pulse 79   Temp 98.8 F (37.1 C) (Oral)   Resp 16   LMP 06/10/2024   SpO2 98%   Visual Acuity Right Eye Distance:   Left Eye Distance:   Bilateral Distance:    Right Eye Near:   Left Eye Near:    Bilateral Near:     Physical Exam  GEN: non-toxic appearing female, in no acute distress  CV: regular rate and rhythm RESP: no increased work of breathing, clear to ascultation bilaterally ABD: Bowel sounds present. Soft, RUQ, epigastric, LUQ  TTP non-tender, non-distended.  No guarding, no rebound, no appreciable hepatosplenomegaly, no CVA tenderness, negative McBurney's, negative Murphy MSK: no extremity edema SKIN: warm, dry, no rash on visible skin NEURO: alert, moves all extremities appropriately PSYCH: Normal affect, appropriate speech and behavior   UC Treatments / Results  Labs (all labs ordered are listed,  but only abnormal results are displayed) Labs  Reviewed - No data to display  EKG  If EKG performed, see my interpretation and MDM section  Radiology    Procedures Procedures (including critical care time)  Medications Ordered in UC Medications  lidocaine  (XYLOCAINE ) 2 % viscous mouth solution 15 mL (15 mLs Mouth/Throat Given 06/15/24 1936)  alum & mag hydroxide-simeth (MAALOX/MYLANTA) 200-200-20 MG/5ML suspension 30 mL (30 mLs Oral Given 06/15/24 1936)    Initial Impression / Assessment and Plan / UC Course  I have reviewed the triage vital signs and the nursing notes.  Pertinent labs & imaging results that were available during my care of the patient were reviewed by me and considered in my medical decision making (see chart for details).       Patient is a  38 y.o. female who presents after having insidious diarrhea, vomiting  with dyspepsia and abdominal pain. Given GI cocktail.  Overall, patient is non-toxic-appearing, well-hydrated, and in no acute distress.  Vital signs stable.  Melanieis afebrile.  Exam is not concerning for an acute abdomen but she does have upper abdominal tenderness.  Discussed food borne vs viral vs bacterial gastroenteritis.  Obtained GI pathogen panel ordered to assess for bacterial vs viral of her diarrhea. Strict ED precautions as she could also have cholecystitis.  She would like to avoid the ED for now. Prescribed Pepcid  and Prilosec for dyspepsia.   Follow-up, return and ED precautions given.  Discussed MDM, treatment plan and plan for follow-up with patient who agrees with plan.    Final Clinical Impressions(s) / UC Diagnoses   Final diagnoses:  Diarrhea, unspecified type  Abdominal cramping  Dyspepsia  Nausea without vomiting     Discharge Instructions      See handout regarding food choices to help relieve diarrhea. Increasing Welbutrin and Naltrexone  can cause diarrhea. Stop by the pharmacy to pick up your prescriptions.  Follow up with your primary care provider or return to the  urgent care, if not improving.       ED Prescriptions     Medication Sig Dispense Auth. Provider   famotidine  (PEPCID ) 20 MG tablet Take 1 tablet (20 mg total) by mouth 2 (two) times daily. 30 tablet Kenyetta Fife, DO   omeprazole  (PRILOSEC) 20 MG capsule Take 1 capsule (20 mg total) by mouth 2 (two) times daily before a meal. 60 capsule Pamila Mendibles, DO      PDMP not reviewed this encounter.   Tashawnda Bleiler, DO 06/22/24 1803

## 2024-06-15 NOTE — ED Triage Notes (Signed)
 Pt presents with RUQ abdominal pain, nausea, diarrhea, heart burn and bloating x 1 week. Pt has noticed that she was taking naltrexone  incorrectly for 1 week. She was supposed to take half a tablet and was taking a full tablet.

## 2024-06-16 ENCOUNTER — Other Ambulatory Visit: Payer: Self-pay

## 2024-06-16 ENCOUNTER — Other Ambulatory Visit
Admission: RE | Admit: 2024-06-16 | Discharge: 2024-06-16 | Disposition: A | Discharge status: Home / Self Care | Source: Ambulatory Visit | Attending: Family Medicine | Admitting: Family Medicine

## 2024-06-16 DIAGNOSIS — R197 Diarrhea, unspecified: Secondary | ICD-10-CM | POA: Insufficient documentation

## 2024-06-16 DIAGNOSIS — R140 Abdominal distension (gaseous): Secondary | ICD-10-CM | POA: Diagnosis present

## 2024-06-16 DIAGNOSIS — R109 Unspecified abdominal pain: Secondary | ICD-10-CM | POA: Diagnosis present

## 2024-06-16 LAB — GASTROINTESTINAL PANEL BY PCR, STOOL (REPLACES STOOL CULTURE)

## 2024-06-20 ENCOUNTER — Emergency Department

## 2024-06-20 ENCOUNTER — Other Ambulatory Visit: Payer: Self-pay

## 2024-06-20 ENCOUNTER — Emergency Department
Admission: EM | Admit: 2024-06-20 | Discharge: 2024-06-20 | Disposition: A | Discharge status: Home / Self Care | Attending: Emergency Medicine | Admitting: Emergency Medicine

## 2024-06-20 DIAGNOSIS — R110 Nausea: Secondary | ICD-10-CM | POA: Insufficient documentation

## 2024-06-20 DIAGNOSIS — R1011 Right upper quadrant pain: Secondary | ICD-10-CM | POA: Diagnosis not present

## 2024-06-20 DIAGNOSIS — K838 Other specified diseases of biliary tract: Secondary | ICD-10-CM | POA: Diagnosis not present

## 2024-06-20 DIAGNOSIS — R197 Diarrhea, unspecified: Secondary | ICD-10-CM | POA: Insufficient documentation

## 2024-06-20 LAB — URINALYSIS, ROUTINE W REFLEX MICROSCOPIC
Bilirubin Urine: NEGATIVE
Glucose, UA: NEGATIVE mg/dL
Hgb urine dipstick: NEGATIVE
Ketones, ur: NEGATIVE mg/dL
Leukocytes,Ua: NEGATIVE
Nitrite: NEGATIVE
Protein, ur: NEGATIVE mg/dL
Specific Gravity, Urine: 1.005 (ref 1.005–1.030)
pH: 6 (ref 5.0–8.0)

## 2024-06-20 LAB — CBC
HCT: 43.7 % (ref 36.0–46.0)
Hemoglobin: 15 g/dL (ref 12.0–15.0)
MCH: 29.5 pg (ref 26.0–34.0)
MCHC: 34.3 g/dL (ref 30.0–36.0)
MCV: 85.9 fL (ref 80.0–100.0)
Platelets: 311 K/uL (ref 150–400)
RBC: 5.09 MIL/uL (ref 3.87–5.11)
RDW: 12.6 % (ref 11.5–15.5)
WBC: 12.3 K/uL — ABNORMAL HIGH (ref 4.0–10.5)
nRBC: 0 % (ref 0.0–0.2)

## 2024-06-20 LAB — COMPREHENSIVE METABOLIC PANEL WITH GFR
ALT: 52 U/L — ABNORMAL HIGH (ref 0–44)
AST: 30 U/L (ref 15–41)
Albumin: 4.1 g/dL (ref 3.5–5.0)
Alkaline Phosphatase: 177 U/L — ABNORMAL HIGH (ref 38–126)
Anion gap: 11 (ref 5–15)
BUN: 15 mg/dL (ref 6–20)
CO2: 26 mmol/L (ref 22–32)
Calcium: 9.8 mg/dL (ref 8.9–10.3)
Chloride: 103 mmol/L (ref 98–111)
Creatinine, Ser: 0.87 mg/dL (ref 0.44–1.00)
GFR, Estimated: >60 mL/min (ref >60)
Glucose, Bld: 102 mg/dL — ABNORMAL HIGH (ref 70–99)
Potassium: 4.1 mmol/L (ref 3.5–5.1)
Sodium: 140 mmol/L (ref 135–145)
Total Bilirubin: 0.6 mg/dL (ref 0.0–1.2)
Total Protein: 7.2 g/dL (ref 6.5–8.1)

## 2024-06-20 LAB — POC URINE PREG, ED: Preg Test, Ur: NEGATIVE

## 2024-06-20 LAB — LIPASE, BLOOD: Lipase: 34 U/L (ref 11–51)

## 2024-06-20 MED ORDER — IOHEXOL 300 MG/ML  SOLN
100.0000 mL | Freq: Once | INTRAMUSCULAR | Status: AC | PRN
  Administered 2024-06-20: 100 mL via INTRAVENOUS

## 2024-06-20 MED ORDER — ONDANSETRON 4 MG PO TBDP
4.0000 mg | ORAL_TABLET | Freq: Three times a day (TID) | ORAL | 0 refills | Status: DC | PRN
Start: 2024-06-20 — End: 2024-08-02
  Filled 2024-06-20: qty 20, 7d supply, fill #0

## 2024-06-20 MED ORDER — ALUM & MAG HYDROXIDE-SIMETH 200-200-20 MG/5ML PO SUSP
30.0000 mL | Freq: Once | ORAL | Status: AC
  Administered 2024-06-20: 30 mL via ORAL
  Filled 2024-06-20: qty 30

## 2024-06-20 MED ORDER — LIDOCAINE VISCOUS HCL 2 % MT SOLN
15.0000 mL | Freq: Once | OROMUCOSAL | Status: AC
  Administered 2024-06-20: 15 mL via ORAL
  Filled 2024-06-20: qty 15

## 2024-06-20 MED ORDER — ONDANSETRON 8 MG PO TBDP
8.0000 mg | ORAL_TABLET | Freq: Once | ORAL | Status: AC
  Administered 2024-06-20: 8 mg via ORAL
  Filled 2024-06-20: qty 1

## 2024-06-20 NOTE — ED Provider Notes (Signed)
 [facility] Emergency Department Provider Note     Event Date/Time   First MD Initiated Contact with Patient 06/20/24 2029     (approximate)   History   Abdominal Pain   HPI  Emily Zhang is a 38 y.o. female with no significant past medical history presents to the ED for evaluation of RUQ pain x 3 weeks with associated clay light-colored diarrhea stools x 1week.  Denies abdominal surgeries, fever.  Endorses nausea without vomiting.  Was evaluated in urgent care on 07/16 for same symptoms.     Physical Exam   Triage Vital Signs: ED Triage Vitals  Encounter Vitals Group     BP 06/20/24 1722 (!) 132/96     Girls Systolic BP Percentile --      Girls Diastolic BP Percentile --      Boys Systolic BP Percentile --      Boys Diastolic BP Percentile --      Pulse Rate 06/20/24 1722 93     Resp 06/20/24 1722 20     Temp 06/20/24 1722 98.9 F (37.2 C)     Temp Source 06/20/24 1722 Oral     SpO2 06/20/24 1722 100 %     Weight 06/20/24 1721 240 lb (108.9 kg)     Height 06/20/24 1721 5' 2 (1.575 m)     Head Circumference --      Peak Flow --      Pain Score 06/20/24 1717 4     Pain Loc --      Pain Education --      Exclude from Growth Chart --     Most recent vital signs: Vitals:   06/20/24 2142 06/20/24 2300  BP: 109/84 115/75  Pulse: 90 73  Resp: 18 20  Temp: 98.1 F (36.7 C)   SpO2: 99% 98%    General Awake, no distress.  HEENT NCAT.  CV:  Good peripheral perfusion.  RESP:  Normal effort.  ABD:  No distention.  NBS x 4.  Soft, tenderness to right upper quadrant on palpation.  Negative Murphy sign.  Negative McBurney's point.    ED Results / Procedures / Treatments   Labs (all labs ordered are listed, but only abnormal results are displayed) Labs Reviewed  COMPREHENSIVE METABOLIC PANEL WITH GFR - Abnormal; Notable for the following components:      Result Value   Glucose, Bld 102 (*)    ALT 52 (*)    Alkaline  Phosphatase 177 (*)    All other components within normal limits  CBC - Abnormal; Notable for the following components:   WBC 12.3 (*)    All other components within normal limits  URINALYSIS, ROUTINE W REFLEX MICROSCOPIC - Abnormal; Notable for the following components:   Color, Urine STRAW (*)    APPearance CLEAR (*)    All other components within normal limits  LIPASE, BLOOD  POC URINE PREG, ED   RADIOLOGY  I personally viewed and evaluated these images as part of my medical decision making, as well as reviewing the written report by the radiologist.  ED Provider Interpretation: Normal CT abdomen pelvis  Ultrasound reveals gallbladder sludge without acute cholecystitis or biliary duct dilation.  CT ABDOMEN PELVIS W CONTRAST Result Date: 06/20/2024 EXAM: CT ABDOMEN AND PELVIS WITH CONTRAST 06/20/2024 10:12:44 PM TECHNIQUE: CT of the abdomen and pelvis was performed with the administration of intravenous contrast. Multiplanar reformatted images are provided for review. Automated exposure control, iterative reconstruction,  and/or weight based adjustment of the mA/kV was utilized to reduce the radiation dose to as low as reasonably achievable. COMPARISON: 01/04/2008 CLINICAL HISTORY: Abdominal pain RUQ (-) US . Pt complains of RUQ pain ongoing 3-4 weeks progressively worsening. Worse after meals. Associated symptoms include clay colored stools noticed last week. No abdominal surgery history. FINDINGS: LOWER CHEST: No acute abnormality. LIVER: The liver is unremarkable. GALLBLADDER AND BILE DUCTS: Gallbladder is unremarkable. No biliary ductal dilatation. SPLEEN: No acute abnormality. PANCREAS: No acute abnormality. ADRENAL GLANDS: No acute abnormality. KIDNEYS, URETERS AND BLADDER: No stones in the kidneys or ureters. No hydronephrosis. No perinephric or periureteral stranding. Urinary bladder is unremarkable. GI AND BOWEL: Stomach demonstrates no acute abnormality. There is no bowel obstruction.  No bowel wall thickening. Normal appendix (image 58). PERITONEUM AND RETROPERITONEUM: No ascites. No free air. VASCULATURE: Aorta is normal in caliber. LYMPH NODES: No lymphadenopathy. REPRODUCTIVE ORGANS: Uterus is within normal limits. BONES AND SOFT TISSUES: Degenerative changes of the lower thoracic spine. No acute osseous abnormality. No focal soft tissue abnormality. IMPRESSION: 1. No acute findings in the abdomen or pelvis. Electronically signed by: Pinkie Pebbles MD 06/20/2024 10:17 PM EDT RP Workstation: [id number]   US  Abdomen Limited RUQ (LIVER/GB) Result Date: 06/20/2024 EXAM: Right Upper Quadrant Abdominal Ultrasound 06/20/2024 06:17:35 PM TECHNIQUE: Real-time ultrasonography of the right upper quadrant of the abdomen was performed. COMPARISON: 03/29/2018 from [facility]. CLINICAL HISTORY: RUQ pain. FINDINGS: LIVER: The liver demonstrates normal echogenicity. No intrahepatic biliary ductal dilatation. No evidence of mass. BILIARY SYSTEM: Dependent gallbladder sludge. No wall thickening or pericholecystic fluid. No sonographic Murphy's sign. Common bile duct is within normal limits measuring 3 mm. RIGHT KIDNEY: The right kidney is grossly unremarkable in appearances without evidence of hydronephrosis, echogenic calculi or worrisome mass lesions. OTHER: No right upper quadrant ascites. IMPRESSION: 1. Dependent gallbladder sludge without acute cholecystitis or biliary duct dilatation. Electronically signed by: Rockey Kilts MD 06/20/2024 06:44 PM EDT RP Workstation: [id number]    PROCEDURES:  Critical Care performed: No  Procedures   MEDICATIONS ORDERED IN ED: Medications  alum & mag hydroxide-simeth (MAALOX/MYLANTA) 200-200-20 MG/5ML suspension 30 mL (30 mLs Oral Given 06/20/24 2138)    And  lidocaine  (XYLOCAINE ) 2 % viscous mouth solution 15 mL (15 mLs Oral Given 06/20/24 2138)  ondansetron  (ZOFRAN -ODT) disintegrating tablet 8 mg (8 mg Oral Given 06/20/24 2138)  iohexol  (OMNIPAQUE ) 300  MG/ML solution 100 mL (100 mLs Intravenous Contrast Given 06/20/24 2213)     IMPRESSION / MDM / ASSESSMENT AND PLAN / ED COURSE  I reviewed the triage vital signs and the nursing notes.                               38 y.o. female presents to the emergency department for evaluation and treatment of RUQ pain. See HPI for further details.   Differential diagnosis includes, but is not limited to, biliary disease (biliary colic, acute cholecystitis, cholangitis, choledocholithiasis), gastritis, duodenitis, pancreatitis, small bowel or large bowel obstruction, ulcer(s).    Patient's presentation is most consistent with acute complicated illness / injury requiring diagnostic workup.  Patient is alert and oriented and she presents with right upper quadrant pain ongoing for 3 weeks that is worse with eating and associated nausea and clay colored stools for 1 week.  Concern initially for biliary etiology such as cholelithiasis or biliary obstruction.  Ultrasound shows gallbladder sludge without acute cholecystitis or biliary duct dilation or gallstones.  Reassuring  CT abdomen pelvis.  Lab work is notable for mild leukocytosis of 12.3, mildly elevated ALT of 52, and elevated alkaline phosphatase of 177.  Given stated clay gray-colored stool I do suspect biliary etiology versus less likely hepatic pathology.  Patient is stable without signs of systemic infection or acute surgical abdominal indications. Does not meet admission criteria.  Advised close GI follow-up for further management and evaluation.  Patient discharged with ED return precautions.   FINAL CLINICAL IMPRESSION(S) / ED DIAGNOSES   Final diagnoses:  Right upper quadrant abdominal pain     Rx / DC Orders   ED Discharge Orders          Ordered    ondansetron  (ZOFRAN -ODT) 4 MG disintegrating tablet  Every 8 hours PRN        06/20/24 2245             Note:  This document was prepared using Dragon voice recognition software and  may include unintentional dictation errors.    Margrette, Arleigh Dicola A, PA-C 06/21/24 9056    Jossie Artist POUR, MD 06/21/24 [phone]

## 2024-06-20 NOTE — ED Triage Notes (Signed)
 Pt to ED for RUQ abdominal pain since 3 weeks, has been taking pepcid  prescribed at [facility].  Past 3 days having severe RUQ pain after eating. Had severe RUQ pain today with bending over. Has GB. Had clay colored stools last week. Urine is normal color.  PA Margrette saw pt in triage.

## 2024-06-20 NOTE — ED Provider Triage Note (Signed)
 Emergency Medicine Provider Triage Evaluation Note  Emily Zhang , a 38 y.o. female  was evaluated in triage.  Pt complains of RUQ pain ongoing 3-4 weeks progressively worsening.  Worse after meals.  Associated symptoms include clay colored stools noticed last week.  No abdominal surgery history.  Review of Systems  Positive:  Negative:   Physical Exam  BP (!) 132/96 (BP Location: Left Arm)   Pulse 93   Temp 98.9 F (37.2 C) (Oral)   Resp 20   Ht 5' 2 (1.575 m)   Wt 108.9 kg   LMP 06/10/2024 (Approximate)   SpO2 100%   BMI 43.90 kg/m  Gen:   Awake, no distress   Resp:  Normal effort  MSK:   Moves extremities without difficulty  Other:  Tenderness to right upper quadrant  Medical Decision Making  Medically screening exam initiated at 5:30 PM.  Appropriate orders placed.  Emily Zhang was informed that the remainder of the evaluation will be completed by another provider, this initial triage assessment does not replace that evaluation, and the importance of remaining in the ED until their evaluation is complete.     Margrette, Iann Rodier A, PA-C 06/20/24 1731

## 2024-06-20 NOTE — Discharge Instructions (Addendum)
 You were evaluated in the ED for abdominal pain.  Your ultrasound reveals gallbladder sludge without acute cholecystitis or biliary duct dilation.  Your CT abdomen pelvis is normal.  We would like for you to follow-up with GI for further evaluation.  Please call and schedule appointment with Dr. Aundria.  Continue to take medication as prescribed by urgent care 30 minutes to 60 minutes prior to your meals.

## 2024-06-20 NOTE — ED Notes (Signed)
 Pt provided discharge instructions and prescription information. Pt was given the opportunity to ask questions and questions were answered.

## 2024-06-21 ENCOUNTER — Other Ambulatory Visit: Payer: Self-pay

## 2024-07-05 ENCOUNTER — Institutional Professional Consult (permissible substitution) (INDEPENDENT_AMBULATORY_CARE_PROVIDER_SITE_OTHER): Admitting: Family Medicine

## 2024-08-01 NOTE — Progress Notes (Unsigned)
 Established patient visit  Patient: Emily Zhang   DOB: 1986/04/28   38 y.o. Female  MRN: [id number] Visit Date: 08/02/2024  Today's healthcare provider: Jolynn Spencer, PA-C   No chief complaint on file.  Subjective       Discussed the use of AI scribe software for clinical note transcription with the patient, who gave verbal consent to proceed.  History of Present Illness        05/31/2024   10:32 AM 04/13/2023    9:19 AM 01/13/2023   10:34 AM  Depression screen PHQ 2/9  Decreased Interest 0 0 0  Down, Depressed, Hopeless 0 0 0  PHQ - 2 Score 0 0 0  Altered sleeping 2 1 2   Tired, decreased energy 2 1 0  Change in appetite 1 1 1   Feeling bad or failure about yourself  0 0 0  Trouble concentrating 0 0 0  Moving slowly or fidgety/restless 0 0 0  Suicidal thoughts 0 0 0  PHQ-9 Score 5 3 3   Difficult doing work/chores Somewhat difficult Not difficult at all Not difficult at all      05/31/2024   10:32 AM  GAD 7 : Generalized Anxiety Score  Nervous, Anxious, on Edge 0  Control/stop worrying 0  Worry too much - different things 0  Trouble relaxing 1  Restless 0  Easily annoyed or irritable 0  Afraid - awful might happen 0  Total GAD 7 Score 1  Anxiety Difficulty Somewhat difficult    Medications: Outpatient Medications Prior to Visit  Medication Sig  . buPROPion  (WELLBUTRIN  XL) 300 MG 24 hr tablet Take 1 tablet (300 mg total) by mouth daily.  . famotidine  (PEPCID ) 20 MG tablet Take 1 tablet (20 mg total) by mouth 2 (two) times daily.  . Multiple Vitamin (MULTIVITAMIN) tablet Take 1 tablet by mouth daily.  . naltrexone  (DEPADE) 50 MG tablet Take 0.5 tablets (25 mg total) by mouth daily.  . omeprazole  (PRILOSEC) 20 MG capsule Take 1 capsule (20 mg total) by mouth 2 (two) times daily before a meal.  . ondansetron  (ZOFRAN -ODT) 4 MG disintegrating tablet Take 1 tablet (4 mg total) by mouth every 8 (eight) hours as needed.   No facility-administered  medications prior to visit.    Review of Systems  All other systems reviewed and are negative.  All negative Except see HPI   {Insert previous labs (optional):23779} {See past labs  Heme  Chem  Endocrine  Serology  Results Review (optional):1}   Objective    There were no vitals taken for this visit. {Insert last BP/Wt (optional):23777}{See vitals history (optional):1}   Physical Exam Vitals reviewed.  Constitutional:      General: She is not in acute distress.    Appearance: Normal appearance. She is well-developed. She is not diaphoretic.  HENT:     Head: Normocephalic and atraumatic.  Eyes:     General: No scleral icterus.    Conjunctiva/sclera: Conjunctivae normal.  Neck:     Thyroid : No thyromegaly.  Cardiovascular:     Rate and Rhythm: Normal rate and regular rhythm.     Pulses: Normal pulses.     Heart sounds: Normal heart sounds. No murmur heard. Pulmonary:     Effort: Pulmonary effort is normal. No respiratory distress.     Breath sounds: Normal breath sounds. No wheezing, rhonchi or rales.  Musculoskeletal:     Cervical back: Neck supple.     Right lower leg: No edema.  Left lower leg: No edema.  Lymphadenopathy:     Cervical: No cervical adenopathy.  Skin:    General: Skin is warm and dry.     Findings: No rash.  Neurological:     Mental Status: She is alert and oriented to person, place, and time. Mental status is at baseline.  Psychiatric:        Mood and Affect: Mood normal.        Behavior: Behavior normal.     No results found for any visits on 08/02/24.      Assessment and Plan Assessment & Plan     No orders of the defined types were placed in this encounter.   No follow-ups on file.   The patient was advised to call back or seek an in-person evaluation if the symptoms worsen or if the condition fails to improve as anticipated.  I discussed the assessment and treatment plan with the patient. The patient was provided an  opportunity to ask questions and all were answered. The patient agreed with the plan and demonstrated an understanding of the instructions.  I, Marinell Igarashi, PA-C have reviewed all documentation for this visit. The documentation on 08/02/2024  for the exam, diagnosis, procedures, and orders are all accurate and complete.  Jolynn Spencer, [facility], MMS [facility] [phone] (phone) [phone] (fax)  [facility]

## 2024-08-02 ENCOUNTER — Encounter: Payer: Self-pay | Admitting: Physician Assistant

## 2024-08-02 ENCOUNTER — Ambulatory Visit: Admitting: Physician Assistant

## 2024-08-02 VITALS — BP 116/80 | HR 65 | Temp 99.0°F | Ht 62.0 in | Wt 235.3 lb

## 2024-08-02 DIAGNOSIS — E559 Vitamin D deficiency, unspecified: Secondary | ICD-10-CM | POA: Insufficient documentation

## 2024-08-02 DIAGNOSIS — R1011 Right upper quadrant pain: Secondary | ICD-10-CM | POA: Diagnosis not present

## 2024-08-02 DIAGNOSIS — F419 Anxiety disorder, unspecified: Secondary | ICD-10-CM

## 2024-08-02 DIAGNOSIS — F331 Major depressive disorder, recurrent, moderate: Secondary | ICD-10-CM | POA: Insufficient documentation

## 2024-08-02 DIAGNOSIS — E7849 Other hyperlipidemia: Secondary | ICD-10-CM | POA: Diagnosis not present

## 2024-08-02 DIAGNOSIS — R030 Elevated blood-pressure reading, without diagnosis of hypertension: Secondary | ICD-10-CM

## 2024-08-09 ENCOUNTER — Encounter: Payer: Self-pay | Admitting: Physician Assistant

## 2024-08-09 ENCOUNTER — Other Ambulatory Visit: Payer: Self-pay

## 2024-08-23 ENCOUNTER — Encounter: Payer: Self-pay | Admitting: General Surgery

## 2024-08-23 ENCOUNTER — Ambulatory Visit: Admitting: General Surgery

## 2024-08-23 VITALS — BP 109/73 | HR 74 | Temp 98.7°F | Ht 63.0 in | Wt 235.8 lb

## 2024-08-23 DIAGNOSIS — K8050 Calculus of bile duct without cholangitis or cholecystitis without obstruction: Secondary | ICD-10-CM

## 2024-08-23 NOTE — Patient Instructions (Signed)
 You have requested to have your gallbladder removed. This will be done at [facility] with Dr. Maurine Minister.  If you are on any injectable weight loss medication, you will need to stop taking your GLP-1 injectable (weight loss) medications 8 days before your surgery to avoid any complications with anesthesia.   You will most likely be out of work 1-2 weeks for this surgery.  If you have FMLA or disability paperwork that needs filled out you may drop this off at our office or this can be faxed to [phone].  You will return after your post-op appointment with a lifting restriction for approximately 4 more weeks.  You will be able to eat anything you would like to following surgery. But, start by eating a bland diet and advance this as tolerated. The Gallbladder diet is below, please go as closely by this diet as possible prior to surgery to avoid any further attacks.  Please see the (blue)pre-care form that you have been given today. Our surgery scheduler will call you to verify surgery date and to go over information.   If you have any questions, please call our office.  Laparoscopic Cholecystectomy Laparoscopic cholecystectomy is surgery to remove the gallbladder. The gallbladder is located in the upper right part of the abdomen, behind the liver. It is a storage sac for bile, which is produced in the liver. Bile aids in the digestion and absorption of fats. Cholecystectomy is often done for inflammation of the gallbladder (cholecystitis). This condition is usually caused by a buildup of gallstones (cholelithiasis) in the gallbladder. Gallstones can block the flow of bile, and that can result in inflammation and pain. In severe cases, emergency surgery may be required. If emergency surgery is not required, you will have time to prepare for the procedure. Laparoscopic surgery is an alternative to open surgery. Laparoscopic surgery has a shorter recovery time. Your common bile duct may also need  to be examined during the procedure. If stones are found in the common bile duct, they may be removed. LET [facility] CARE PROVIDER KNOW ABOUT: Any allergies you have. All medicines you are taking, including vitamins, herbs, eye drops, creams, and over-the-counter medicines. Previous problems you or members of your family have had with the use of anesthetics. Any blood disorders you have. Previous surgeries you have had.  Any medical conditions you have. RISKS AND COMPLICATIONS Generally, this is a safe procedure. However, problems may occur, including: Infection. Bleeding. Allergic reactions to medicines. Damage to other structures or organs. A stone remaining in the common bile duct. A bile leak from the cyst duct that is clipped when your gallbladder is removed. The need to convert to open surgery, which requires a larger incision in the abdomen. This may be necessary if your surgeon thinks that it is not safe to continue with a laparoscopic procedure. BEFORE THE PROCEDURE Ask your health care provider about: Changing or stopping your regular medicines. This is especially important if you are taking diabetes medicines or blood thinners. Taking medicines such as aspirin and ibuprofen. These medicines can thin your blood. Do not take these medicines before your procedure if your health care provider instructs you not to. Follow instructions from your health care provider about eating or drinking restrictions. Let your health care provider know if you develop a cold or an infection before surgery. Plan to have someone take you home after the procedure. Ask your health care provider how your surgical site will be marked or identified. You may  be given antibiotic medicine to help prevent infection. PROCEDURE To reduce your risk of infection: Your health care team will wash or sanitize their hands. Your skin will be washed with soap. An IV tube may be inserted into one of your  veins. You will be given a medicine to make you fall asleep (general anesthetic). A breathing tube will be placed in your mouth. The surgeon will make several small cuts (incisions) in your abdomen. A thin, lighted tube (laparoscope) that has a tiny camera on the end will be inserted through one of the small incisions. The camera on the laparoscope will send a picture to a TV screen (monitor) in the operating room. This will give the surgeon a good view inside your abdomen. A gas will be pumped into your abdomen. This will expand your abdomen to give the surgeon more room to perform the surgery. Other tools that are needed for the procedure will be inserted through the other incisions. The gallbladder will be removed through one of the incisions. After your gallbladder has been removed, the incisions will be closed with stitches (sutures), staples, or skin glue. Your incisions may be covered with a bandage (dressing). The procedure may vary among health care providers and hospitals. AFTER THE PROCEDURE Your blood pressure, heart rate, breathing rate, and blood oxygen level will be monitored often until the medicines you were given have worn off. You will be given medicines as needed to control your pain.   This information is not intended to replace advice given to you by your health care provider. Make sure you discuss any questions you have with your health care provider.   Document Released: 11/17/2005 Document Revised: 08/08/2015 Document Reviewed: 06/29/2013 Elsevier Interactive Patient Education 2016 Else[employer] Inc.   Low-Fat Diet for Gallbladder Conditions A low-fat diet can be helpful if you have pancreatitis or a gallbladder condition. With these conditions, your pancreas and gallbladder have trouble digesting fats. A healthy eating plan with less fat will help rest your pancreas and gallbladder and reduce your symptoms. WHAT DO I NEED TO KNOW ABOUT THIS DIET? Eat a low-fat  diet. Reduce your fat intake to less than 20-30% of your total daily calories. This is less than 50-60 g of fat per day. Remember that you need some fat in your diet. Ask your dietician what your daily goal should be. Choose nonfat and low-fat healthy foods. Look for the words "nonfat," "low fat," or "fat free." As a guide, look on the label and choose foods with less than 3 g of fat per serving. Eat only one serving. Avoid alcohol. Do not smoke. If you need help quitting, talk with your health care provider. Eat small frequent meals instead of three large heavy meals. WHAT FOODS CAN I EAT? Grains Include healthy grains and starches such as potatoes, wheat bread, fiber-rich cereal, and brown rice. Choose whole grain options whenever possible. In adults, whole grains should account for 45-65% of your daily calories.  Fruits and Vegetables Eat plenty of fruits and vegetables. Fresh fruits and vegetables add fiber to your diet. Meats and Other Protein Sources Eat lean meat such as chicken and pork. Trim any fat off of meat before cooking it. Eggs, fish, and beans are other sources of protein. In adults, these foods should account for 10-35% of your daily calories. Dairy Choose low-fat milk and dairy options. Dairy includes fat and protein, as well as calcium.  Fats and Oils Limit high-fat foods such as fried foods, sweets, baked  goods, sugary drinks.  Other Creamy sauces and condiments, such as mayonnaise, can add extra fat. Think about whether or not you need to use them, or use smaller amounts or low fat options. WHAT FOODS ARE NOT RECOMMENDED? High fat foods, such as: [employer]. Ice cream. [place] toast. Sweet rolls. Pizza. Cheese bread. Foods covered with batter, butter, creamy sauces, or cheese. Fried foods. Sugary drinks and desserts. Foods that cause gas or bloating   This information is not intended to replace advice given to you by your health care provider. Make sure you  discuss any questions you have with your health care provider.   Document Released: 11/22/2013 Document Reviewed: 11/22/2013 Elsevier Interactive Patient Education [employer]

## 2024-08-28 NOTE — Progress Notes (Signed)
 Patient ID: Emily Zhang, female   DOB: 1985/12/31, 38 y.o.   MRN: [id number] CC: Biliary colic  History of Present Illness Emily Zhang is a 38 y.o. female with past medical history as below who presents in consultation for right upper quadrant pain.  The patient reports that in July of this year she developed postprandial right upper quadrant pain that persisted for about 3 weeks..  She said that this was also associated with nausea and heartburn.  She also felt bloated and was having loose stools.  The pain described as radiating to her back.  The pain persisted so she actually went to the ED.  Her workup there showed an ultrasound with biliary sludge and a CT scan was negative for acute intra-abdominal pathology.  She did have a increased alkaline phosphatase and a leukocytosis at that time.  She also reports clay colored stool.  She actually saw Dr. Unk with gastroenterology who was concerned with biliary colic so she was referred here.  Past Medical History Past Medical History:  Diagnosis Date   Allergic rhinitis    Arthritis 2019   Knee   Bone spur 05/2017   cervical   Obesity    Ovarian cyst    Viral pneumonia 12/2014       Past Surgical History:  Procedure Laterality Date   DENTAL SURGERY  2013; 12/2016   WISDOM TEETH EXTRACTION   KNEE ARTHROSCOPY Right 01/06/2024    Allergies  Allergen Reactions   Hydrocodone-Acetaminophen  Rash   Codeine Other (See Comments)   Vicodin [Hydrocodone-Acetaminophen ] Rash    Current Outpatient Medications  Medication Sig Dispense Refill   buPROPion  (WELLBUTRIN  XL) 300 MG 24 hr tablet Take 1 tablet (300 mg total) by mouth daily. 90 tablet 1   Multiple Vitamin (MULTIVITAMIN) tablet Take 1 tablet by mouth daily.     No current facility-administered medications for this visit.    Family History Family History  Problem Relation Age of Onset   Melanoma Maternal Grandmother 24   Lung cancer Maternal Grandfather     Heart disease Maternal Grandfather        CARDIAC ARREST   Colon cancer Maternal Grandfather 67   Diabetes Paternal Grandfather    Colon cancer Maternal Uncle 40   Lung cancer Maternal Uncle    Endometriosis Cousin        MATERNAL 1ST COUSIN       Social History Social History   Tobacco Use   Smoking status: Former    Current packs/day: 0.00    Types: Cigarettes    Quit date: 05/2014    Years since quitting: 10.3   Smokeless tobacco: Never   Tobacco comments:    1 pack/week; 2003-2015  Vaping Use   Vaping status: Never Used  Substance Use Topics   Alcohol use: Not Currently    Comment: 2-3/WK   Drug use: No        ROS Full ROS of systems performed and is otherwise negative there than what is stated in the HPI  Physical Exam Blood pressure 109/73, pulse 74, temperature 98.7 F (37.1 C), temperature source Oral, height 5' 3 (1.6 m), weight 235 lb 12.8 oz (107 kg), last menstrual period 07/05/2024, SpO2 99%.  Alert and oriented x 3, normal work of breathing room air, regular rate and rhythm, abdomen soft, nontender nondistended, negative Murphy sign, moving extremities spontaneously Data Reviewed I reviewed her ultrasound.  This was significant for a gallbladder with sludge in it.  I did  not see any large stones.  On labs she did have an alkaline phosphatase increase.  Her labs from July she also had a slight leukocytosis of 12,000.  I have personally reviewed the patient's imaging and medical records.    Assessment    38 year old female who presents with several week history of right upper quadrant pain after eating in July.  She has been trying to change her diet and eating less fatty foods and she says the pain has decreased.  I discussed with her that I am concerned that this is secondary to biliary colic.  I did recommend a robotic assisted cholecystectomy.  She is a little hesitant about surgery but I discussed the risk, benefits alternatives of surgery including  risk of infection, bleeding, injury to common bile duct, retained stone and bile leak.  She understands these risks.  She will discuss with her family and call us  back with a date that will work for her.     Jayson MALVA Endow

## 2024-08-30 ENCOUNTER — Telehealth: Payer: Self-pay | Admitting: General Surgery

## 2024-08-30 NOTE — Telephone Encounter (Signed)
 Outgoing call to patient, left voice mail through her [employer] assistance to call us  regarding status of scheduling robotic cholecystectomy with Dr. Marinda.

## 2024-09-02 ENCOUNTER — Telehealth: Payer: Self-pay | Admitting: General Surgery

## 2024-09-02 NOTE — Telephone Encounter (Signed)
 Outgoing call, spoke with patient.  At this time she is still trying to arrange help with family for post care.  Is probably looking at November for scheduling of surgery. She will call when ready.

## 2024-10-29 NOTE — Progress Notes (Unsigned)
 Established patient visit  Patient: Emily Zhang   DOB: [birth year]   38 y.o. Female  MRN: [id number] Visit Date: 11/01/2024  Today's healthcare provider: Jolynn Spencer, PA-C   No chief complaint on file.  Subjective       Discussed the use of AI scribe software for clinical note transcription with the patient, who gave verbal consent to proceed.  History of Present Illness        08/02/2024   10:25 AM 05/31/2024   10:32 AM 04/13/2023    9:19 AM  Depression screen PHQ 2/9  Decreased Interest 0 0 0  Down, Depressed, Hopeless 0 0 0  PHQ - 2 Score 0 0 0  Altered sleeping 1 2 1   Tired, decreased energy 1 2 1   Change in appetite 0 1 1  Feeling bad or failure about yourself  0 0 0  Trouble concentrating 0 0 0  Moving slowly or fidgety/restless 0 0 0  Suicidal thoughts 0 0 0  PHQ-9 Score 2  5  3    Difficult doing work/chores Not difficult at all Somewhat difficult Not difficult at all     Data saved with a previous flowsheet row definition      08/02/2024   10:25 AM 05/31/2024   10:32 AM  GAD 7 : Generalized Anxiety Score  Nervous, Anxious, on Edge 0 0  Control/stop worrying 0 0  Worry too much - different things 0 0  Trouble relaxing 0 1  Restless 0 0  Easily annoyed or irritable 0 0  Afraid - awful might happen 0 0  Total GAD 7 Score 0 1  Anxiety Difficulty Not difficult at all Somewhat difficult    Medications: Outpatient Medications Prior to Visit  Medication Sig  . buPROPion  (WELLBUTRIN  XL) 300 MG 24 hr tablet Take 1 tablet (300 mg total) by mouth daily.  . Multiple Vitamin (MULTIVITAMIN) tablet Take 1 tablet by mouth daily.   No facility-administered medications prior to visit.    Review of Systems  All other systems reviewed and are negative.  All negative Except see HPI   {Insert previous labs (optional):23779} {See past labs  Heme  Chem  Endocrine  Serology  Results Review (optional):1}   Objective    There were no vitals taken for  this visit. {Insert last BP/Wt (optional):23777}{See vitals history (optional):1}   Physical Exam Vitals reviewed.  Constitutional:      General: She is not in acute distress.    Appearance: Normal appearance. She is well-developed. She is not diaphoretic.  HENT:     Head: Normocephalic and atraumatic.  Eyes:     General: No scleral icterus.    Conjunctiva/sclera: Conjunctivae normal.  Neck:     Thyroid : No thyromegaly.  Cardiovascular:     Rate and Rhythm: Normal rate and regular rhythm.     Pulses: Normal pulses.     Heart sounds: Normal heart sounds. No murmur heard. Pulmonary:     Effort: Pulmonary effort is normal. No respiratory distress.     Breath sounds: Normal breath sounds. No wheezing, rhonchi or rales.  Musculoskeletal:     Cervical back: Neck supple.     Right lower leg: No edema.     Left lower leg: No edema.  Lymphadenopathy:     Cervical: No cervical adenopathy.  Skin:    General: Skin is warm and dry.     Findings: No rash.  Neurological:     Mental Status: She is alert and oriented to  person, place, and time. Mental status is at baseline.  Psychiatric:        Mood and Affect: Mood normal.        Behavior: Behavior normal.     No results found for any visits on 11/01/24.      Assessment and Plan Assessment & Plan     No orders of the defined types were placed in this encounter.   No follow-ups on file.   The patient was advised to call back or seek an in-person evaluation if the symptoms worsen or if the condition fails to improve as anticipated.  I discussed the assessment and treatment plan with the patient. The patient was provided an opportunity to ask questions and all were answered. The patient agreed with the plan and demonstrated an understanding of the instructions.  I, Jalie Eiland, PA-C have reviewed all documentation for this visit. The documentation on 11/01/2024  for the exam, diagnosis, procedures, and orders are all accurate  and complete.  Jolynn Spencer, [facility], MMS [facility] [phone] (phone) [phone] (fax)  [facility]

## 2024-11-01 ENCOUNTER — Ambulatory Visit: Admitting: Physician Assistant

## 2024-11-01 ENCOUNTER — Encounter: Payer: Self-pay | Admitting: Physician Assistant

## 2024-11-01 DIAGNOSIS — F419 Anxiety disorder, unspecified: Secondary | ICD-10-CM

## 2024-11-01 DIAGNOSIS — R748 Abnormal levels of other serum enzymes: Secondary | ICD-10-CM

## 2024-11-01 DIAGNOSIS — E7849 Other hyperlipidemia: Secondary | ICD-10-CM

## 2024-11-01 DIAGNOSIS — Z0000 Encounter for general adult medical examination without abnormal findings: Secondary | ICD-10-CM

## 2024-11-01 DIAGNOSIS — F331 Major depressive disorder, recurrent, moderate: Secondary | ICD-10-CM | POA: Diagnosis not present

## 2024-11-01 DIAGNOSIS — E559 Vitamin D deficiency, unspecified: Secondary | ICD-10-CM | POA: Diagnosis not present

## 2024-11-01 DIAGNOSIS — R030 Elevated blood-pressure reading, without diagnosis of hypertension: Secondary | ICD-10-CM | POA: Diagnosis not present

## 2024-11-01 DIAGNOSIS — R1011 Right upper quadrant pain: Secondary | ICD-10-CM

## 2024-11-02 LAB — CBC WITH DIFFERENTIAL/PLATELET
Basophils Absolute: 0.1 x10E3/uL (ref 0.0–0.2)
Basos: 1 %
EOS (ABSOLUTE): 0.3 x10E3/uL (ref 0.0–0.4)
Eos: 4 %
Hematocrit: 43 % (ref 34.0–46.6)
Hemoglobin: 14.1 g/dL (ref 11.1–15.9)
Immature Grans (Abs): 0 x10E3/uL (ref 0.0–0.1)
Immature Granulocytes: 0 %
Lymphocytes Absolute: 2 x10E3/uL (ref 0.7–3.1)
Lymphs: 27 %
MCH: 30.7 pg (ref 26.6–33.0)
MCHC: 32.8 g/dL (ref 31.5–35.7)
MCV: 94 fL (ref 79–97)
Monocytes Absolute: 0.7 x10E3/uL (ref 0.1–0.9)
Monocytes: 9 %
Neutrophils Absolute: 4.6 x10E3/uL (ref 1.4–7.0)
Neutrophils: 59 %
Platelets: 324 x10E3/uL (ref 150–450)
RBC: 4.6 x10E6/uL (ref 3.77–5.28)
RDW: 12.9 % (ref 11.7–15.4)
WBC: 7.7 x10E3/uL (ref 3.4–10.8)

## 2024-11-02 LAB — COMPREHENSIVE METABOLIC PANEL WITH GFR
ALT: 17 IU/L (ref 0–32)
AST: 19 IU/L (ref 0–40)
Albumin: 4.3 g/dL (ref 3.9–4.9)
Alkaline Phosphatase: 123 IU/L — ABNORMAL HIGH (ref 41–116)
BUN/Creatinine Ratio: 15 (ref 9–23)
BUN: 15 mg/dL (ref 6–24)
Bilirubin Total: 0.6 mg/dL (ref 0.0–1.2)
CO2: 23 mmol/L (ref 20–29)
Calcium: 9.7 mg/dL (ref 8.7–10.2)
Chloride: 102 mmol/L (ref 96–106)
Creatinine, Ser: 0.97 mg/dL (ref 0.57–1.00)
Globulin, Total: 2.3 g/dL (ref 1.5–4.5)
Glucose: 96 mg/dL (ref 70–99)
Potassium: 4.3 mmol/L (ref 3.5–5.2)
Sodium: 139 mmol/L (ref 134–144)
Total Protein: 6.6 g/dL (ref 6.0–8.5)
eGFR: 76 mL/min/1.73 (ref >59)

## 2024-11-02 LAB — HEMOGLOBIN A1C
Est. average glucose Bld gHb Est-mCnc: 108 mg/dL
Hgb A1c MFr Bld: 5.4 % (ref 4.8–5.6)

## 2024-11-02 LAB — LIPID PANEL
Chol/HDL Ratio: 5.2 ratio — ABNORMAL HIGH (ref 0.0–4.4)
Cholesterol, Total: 235 mg/dL — ABNORMAL HIGH (ref 100–199)
HDL: 45 mg/dL (ref >39)
LDL Chol Calc (NIH): 159 mg/dL — ABNORMAL HIGH (ref 0–99)
Triglycerides: 171 mg/dL — ABNORMAL HIGH (ref 0–149)
VLDL Cholesterol Cal: 31 mg/dL (ref 5–40)

## 2024-11-03 ENCOUNTER — Telehealth: Payer: Self-pay | Admitting: General Surgery

## 2024-11-03 NOTE — Telephone Encounter (Signed)
 Outgoing call to patient.  She states still not ready at this time for scheduling of surgery for robotic cholecystectomy.  Her Dad had surgery and helping to care for him.  Her last appointment in office was on 08/23/24.  Will need appointment for follow up for re-evaluation.

## 2024-11-08 ENCOUNTER — Other Ambulatory Visit: Payer: Self-pay

## 2024-11-08 ENCOUNTER — Ambulatory Visit: Payer: Self-pay | Admitting: Physician Assistant

## 2024-11-08 DIAGNOSIS — Z1231 Encounter for screening mammogram for malignant neoplasm of breast: Secondary | ICD-10-CM

## 2024-11-08 MED ORDER — ATORVASTATIN CALCIUM 10 MG PO TABS
10.0000 mg | ORAL_TABLET | Freq: Every day | ORAL | 0 refills | Status: DC
  Filled 2024-11-08: qty 90, 90d supply, fill #0

## 2024-11-08 NOTE — Progress Notes (Signed)
 If pt agrees, please, place an order for atorvastatin  10mg  , take 1 tablet by mouth at bedtime, #90, refill 0

## 2024-11-09 ENCOUNTER — Other Ambulatory Visit: Payer: Self-pay

## 2024-11-10 ENCOUNTER — Other Ambulatory Visit: Payer: Self-pay | Admitting: Physician Assistant

## 2024-11-10 ENCOUNTER — Other Ambulatory Visit: Payer: Self-pay

## 2024-11-10 DIAGNOSIS — E7849 Other hyperlipidemia: Secondary | ICD-10-CM

## 2024-11-10 MED ORDER — PRAVASTATIN SODIUM 10 MG PO TABS
10.0000 mg | ORAL_TABLET | Freq: Every day | ORAL | 0 refills | Status: AC
  Filled 2024-11-10: qty 90, 90d supply, fill #0

## 2024-12-30 ENCOUNTER — Other Ambulatory Visit: Payer: Self-pay

## 2025-01-04 ENCOUNTER — Other Ambulatory Visit: Payer: Self-pay

## 2025-01-04 ENCOUNTER — Other Ambulatory Visit: Payer: Self-pay | Admitting: Physician Assistant

## 2025-01-04 MED FILL — Bupropion HCl Tab ER 24HR 300 MG: ORAL | 90 days supply | Qty: 90 | Fill #0 | Status: AC
